# Patient Record
Sex: Male | Born: 1980 | Race: Black or African American | Hispanic: No | Marital: Married | Smoking: Former smoker
Health system: Southern US, Community
[De-identification: ages and names within clinical notes are randomized; demographics above are authoritative.]

## PROBLEM LIST (undated history)

## (undated) DIAGNOSIS — G44019 Episodic cluster headache, not intractable: Secondary | ICD-10-CM

## (undated) DIAGNOSIS — G43909 Migraine, unspecified, not intractable, without status migrainosus: Secondary | ICD-10-CM

## (undated) DIAGNOSIS — G44009 Cluster headache syndrome, unspecified, not intractable: Secondary | ICD-10-CM

## (undated) DIAGNOSIS — R011 Cardiac murmur, unspecified: Secondary | ICD-10-CM

## (undated) HISTORY — PX: NO PAST SURGERIES: SHX2092

## (undated) HISTORY — DX: Episodic cluster headache, not intractable: G44.019

## (undated) HISTORY — DX: Cluster headache syndrome, unspecified, not intractable: G44.009

## (undated) HISTORY — PX: SHOULDER SURGERY: SHX246

## (undated) HISTORY — DX: Migraine, unspecified, not intractable, without status migrainosus: G43.909

---

## 2014-05-14 ENCOUNTER — Emergency Department (HOSPITAL_COMMUNITY)
Admission: EM | Admit: 2014-05-14 | Discharge: 2014-05-14 | Disposition: A | Discharge status: Home / Self Care | Payer: 59 | Attending: Emergency Medicine | Admitting: Emergency Medicine

## 2014-05-14 ENCOUNTER — Emergency Department (HOSPITAL_COMMUNITY): Payer: 59

## 2014-05-14 ENCOUNTER — Encounter (HOSPITAL_COMMUNITY): Payer: Self-pay | Admitting: Emergency Medicine

## 2014-05-14 DIAGNOSIS — R079 Chest pain, unspecified: Secondary | ICD-10-CM | POA: Insufficient documentation

## 2014-05-14 DIAGNOSIS — R51 Headache: Secondary | ICD-10-CM | POA: Insufficient documentation

## 2014-05-14 DIAGNOSIS — R011 Cardiac murmur, unspecified: Secondary | ICD-10-CM | POA: Insufficient documentation

## 2014-05-14 DIAGNOSIS — K5900 Constipation, unspecified: Secondary | ICD-10-CM | POA: Diagnosis not present

## 2014-05-14 DIAGNOSIS — Z720 Tobacco use: Secondary | ICD-10-CM | POA: Insufficient documentation

## 2014-05-14 HISTORY — DX: Cardiac murmur, unspecified: R01.1

## 2014-05-14 LAB — CBC WITH DIFFERENTIAL/PLATELET
BASOS ABS: 0 10*3/uL (ref 0.0–0.1)
BASOS PCT: 0 % (ref 0–1)
EOS ABS: 0.3 10*3/uL (ref 0.0–0.7)
Eosinophils Relative: 4 % (ref 0–5)
HCT: 44.4 % (ref 39.0–52.0)
HEMOGLOBIN: 14.9 g/dL (ref 13.0–17.0)
Lymphocytes Relative: 25 % (ref 12–46)
Lymphs Abs: 1.8 10*3/uL (ref 0.7–4.0)
MCH: 30.2 pg (ref 26.0–34.0)
MCHC: 33.6 g/dL (ref 30.0–36.0)
MCV: 89.9 fL (ref 78.0–100.0)
MONOS PCT: 5 % (ref 3–12)
Monocytes Absolute: 0.4 10*3/uL (ref 0.1–1.0)
NEUTROS ABS: 4.7 10*3/uL (ref 1.7–7.7)
Neutrophils Relative %: 66 % (ref 43–77)
Platelets: 203 10*3/uL (ref 150–400)
RBC: 4.94 MIL/uL (ref 4.22–5.81)
RDW: 12.9 % (ref 11.5–15.5)
WBC: 7.2 10*3/uL (ref 4.0–10.5)

## 2014-05-14 LAB — TROPONIN I

## 2014-05-14 LAB — BASIC METABOLIC PANEL
Anion gap: 4 — ABNORMAL LOW (ref 5–15)
BUN: 11 mg/dL (ref 6–20)
CHLORIDE: 108 mmol/L (ref 101–111)
CO2: 26 mmol/L (ref 22–32)
Calcium: 9.1 mg/dL (ref 8.9–10.3)
Creatinine, Ser: 0.94 mg/dL (ref 0.61–1.24)
GLUCOSE: 95 mg/dL (ref 70–99)
Potassium: 3.6 mmol/L (ref 3.5–5.1)
Sodium: 138 mmol/L (ref 135–145)

## 2014-05-14 MED ORDER — NAPROXEN 500 MG PO TABS: 500.0000 mg | ORAL_TABLET | Freq: Two times a day (BID) | ORAL | Status: DC

## 2014-05-14 MED ORDER — KETOROLAC TROMETHAMINE 60 MG/2ML IM SOLN
60.0000 mg | Freq: Once | INTRAMUSCULAR | Status: AC
  Administered 2014-05-14: 60 mg via INTRAMUSCULAR
  Filled 2014-05-14: qty 2

## 2014-05-14 NOTE — Discharge Instructions (Signed)
Tests were all good. Prescription for pain medicine and try the following for constipation: Dulcolax, milk of Magnesia, magnesium citrate, Miralax.   Increase fluids, fruits, fiber. Try to get a primary care doctor.

## 2014-05-14 NOTE — ED Notes (Signed)
Pt reports increased chest pain for the past 7 days. Pain started in back and spread to chest and L arm. Feeling of sob, dizziness and lightheadedness. Also has feeling of "heart fluttering." also has HA.

## 2014-05-14 NOTE — ED Notes (Signed)
Patient transported to X-ray 

## 2014-05-14 NOTE — ED Notes (Signed)
PT NOT IN ROOM TO DRAW BLOOD

## 2014-05-14 NOTE — ED Provider Notes (Signed)
CSN: [id number]     Arrival date & time 05/14/14  1037 History   First MD Initiated Contact with Patient 05/14/14 1039     Chief Complaint  Patient presents with  . Chest Pain  . Headache     (Consider location/radiation/quality/duration/timing/severity/associated sxs/prior Treatment) HPI....... intermittent left-sided chest pain for 6 years, worse the past week. Symptoms originate in the left upper back and radiate to the left anterior chest. No dyspnea, diaphoresis, nausea. Review systems positive for constipation and headache. He smokes cigars. Negative family history. No other obvious risk factors.   Past Medical History  Diagnosis Date  . Heart murmur    History reviewed. No pertinent past surgical history. History reviewed. No pertinent family history. History  Substance Use Topics  . Smoking status: Current Every Day Smoker  . Smokeless tobacco: Not on file  . Alcohol Use: No    Review of Systems  All other systems reviewed and are negative.     Allergies  Review of patient's allergies indicates no known allergies.  Home Medications   Prior to Admission medications   Medication Sig Start Date End Date Taking? Authorizing Provider  ibuprofen (ADVIL,MOTRIN) 200 MG tablet Take 800 mg by mouth every 6 (six) hours as needed for moderate pain.   Yes Historical Provider, MD  naproxen (NAPROSYN) 500 MG tablet Take 1 tablet (500 mg total) by mouth 2 (two) times daily. 05/14/14   Donnetta HutchingBrian Larrissa Stivers, MD   BP 131/85 mmHg  Pulse 84  Resp 16  SpO2 100% Physical Exam  Constitutional: He is oriented to person, place, and time. He appears well-developed and well-nourished.  HENT:  Head: Normocephalic and atraumatic.  Eyes: Conjunctivae and EOM are normal. Pupils are equal, round, and reactive to light.  Neck: Normal range of motion. Neck supple.  Cardiovascular: Normal rate and regular rhythm.   Pulmonary/Chest: Effort normal and breath sounds normal.  Abdominal: Soft. Bowel sounds  are normal.  Musculoskeletal: Normal range of motion.  Neurological: He is alert and oriented to person, place, and time.  Skin: Skin is warm and dry.  Psychiatric: He has a normal mood and affect. His behavior is normal.  Nursing note and vitals reviewed.   ED Course  Procedures (including critical care time) Labs Review Labs Reviewed  BASIC METABOLIC PANEL - Abnormal; Notable for the following:    Anion gap 4 (*)    All other components within normal limits  CBC WITH DIFFERENTIAL/PLATELET  TROPONIN I    Imaging Review Dg Chest 2 View  05/14/2014   CLINICAL DATA:  Chest pain x7 days  EXAM: CHEST  2 VIEW  COMPARISON:  None.  FINDINGS: Lungs are clear.  No pleural effusion or pneumothorax.  The heart is normal in size.  Visualized osseous structures are within normal limits.  IMPRESSION: Normal chest radiographs.   Electronically Signed   By: Charline BillsSriyesh  Krishnan M.D.   On: 05/14/2014 11:29     EKG Interpretation   Date/Time:  Sunday May 14 2014 10:48:05 EDT Ventricular Rate:  71 PR Interval:  166 QRS Duration: 86 QT Interval:  392 QTC Calculation: 426 R Axis:   86 Text Interpretation:  Sinus rhythm Probable left atrial enlargement ST  elev, probable normal early repol pattern Confirmed by Johnni Wunschel  MD, Kainat Pizana  ([id number]) on 05/14/2014 10:58:25 AM      MDM   Final diagnoses:  Chest pain    Patient is hemodynamically stable. He is very low risk for ACS or pulmonary embolus him.  Screening labs, EKG, chest x-ray all negative. Rx Naprosyn 500 mg for pain.    Donnetta Hutching, MD 05/14/14 1256

## 2015-11-13 ENCOUNTER — Encounter (HOSPITAL_COMMUNITY): Payer: Self-pay | Admitting: Emergency Medicine

## 2015-11-13 ENCOUNTER — Emergency Department (HOSPITAL_COMMUNITY)
Admission: EM | Admit: 2015-11-13 | Discharge: 2015-11-13 | Disposition: A | Discharge status: Home / Self Care | Payer: 59 | Attending: Emergency Medicine | Admitting: Emergency Medicine

## 2015-11-13 DIAGNOSIS — M5442 Lumbago with sciatica, left side: Secondary | ICD-10-CM | POA: Insufficient documentation

## 2015-11-13 DIAGNOSIS — M5431 Sciatica, right side: Secondary | ICD-10-CM

## 2015-11-13 DIAGNOSIS — F17210 Nicotine dependence, cigarettes, uncomplicated: Secondary | ICD-10-CM | POA: Insufficient documentation

## 2015-11-13 DIAGNOSIS — M5432 Sciatica, left side: Secondary | ICD-10-CM

## 2015-11-13 DIAGNOSIS — Z79899 Other long term (current) drug therapy: Secondary | ICD-10-CM | POA: Insufficient documentation

## 2015-11-13 DIAGNOSIS — M5441 Lumbago with sciatica, right side: Secondary | ICD-10-CM | POA: Insufficient documentation

## 2015-11-13 MED ORDER — KETOROLAC TROMETHAMINE 30 MG/ML IJ SOLN
30.0000 mg | Freq: Once | INTRAMUSCULAR | Status: AC
  Administered 2015-11-13: 30 mg via INTRAMUSCULAR
  Filled 2015-11-13: qty 1

## 2015-11-13 MED ORDER — PREDNISONE 10 MG (21) PO TBPK: 10.0000 mg | ORAL_TABLET | Freq: Every day | ORAL | 0 refills | Status: DC

## 2015-11-13 MED ORDER — HYDROCODONE-ACETAMINOPHEN 5-325 MG PO TABS: 1.0000 | ORAL_TABLET | ORAL | 0 refills | Status: DC | PRN

## 2015-11-13 MED ORDER — DIAZEPAM 5 MG PO TABS: 5.0000 mg | ORAL_TABLET | Freq: Two times a day (BID) | ORAL | 0 refills | Status: DC

## 2015-11-13 MED ORDER — IBUPROFEN 800 MG PO TABS: 800.0000 mg | ORAL_TABLET | Freq: Three times a day (TID) | ORAL | 0 refills | Status: DC

## 2015-11-13 MED ORDER — DIAZEPAM 5 MG PO TABS
5.0000 mg | ORAL_TABLET | Freq: Once | ORAL | Status: AC
  Administered 2015-11-13: 5 mg via ORAL
  Filled 2015-11-13: qty 1

## 2015-11-13 MED ORDER — HYDROCODONE-ACETAMINOPHEN 5-325 MG PO TABS
1.0000 | ORAL_TABLET | Freq: Once | ORAL | Status: AC
  Administered 2015-11-13: 1 via ORAL
  Filled 2015-11-13: qty 1

## 2015-11-13 MED ORDER — DEXAMETHASONE SODIUM PHOSPHATE 10 MG/ML IJ SOLN
10.0000 mg | Freq: Once | INTRAMUSCULAR | Status: AC
  Administered 2015-11-13: 10 mg via INTRAMUSCULAR
  Filled 2015-11-13: qty 1

## 2015-11-13 NOTE — ED Notes (Signed)
MD at bedside. 

## 2015-11-13 NOTE — ED Notes (Signed)
+  pulses, cns intact.

## 2015-11-13 NOTE — ED Provider Notes (Signed)
[facility]-EMERGENCY DEPT Provider Note   CSN: [id number] Arrival date & time: 11/13/15  0850     History   Chief Complaint Chief Complaint  Patient presents with  . Back Pain    HPI Richard Gonzalez is a 35 y.o. male.  Pt said that he was pulling up a chicken coop on Sunday (10/29) evening.  He felt a muscle pull in his back and has pain going down both legs.  He denies any problems with bowel or bladder.  The pt said that he has had something like this in the past, but nothing recently.      Past Medical History:  Diagnosis Date  . Heart murmur     There are no active problems to display for this patient.   History reviewed. No pertinent surgical history.     Home Medications    Prior to Admission medications   Medication Sig Start Date End Date Taking? Authorizing Provider  cyclobenzaprine (FLEXERIL) 10 MG tablet Take 10 mg by mouth once.   Yes Historical Provider, MD  diazepam (VALIUM) 5 MG tablet Take 1 tablet (5 mg total) by mouth 2 (two) times daily. 11/13/15   Jacalyn LefevreJulie Lenda Baratta, MD  HYDROcodone-acetaminophen (NORCO/VICODIN) 5-325 MG tablet Take 1 tablet by mouth every 4 (four) hours as needed. 11/13/15   Jacalyn LefevreJulie Lindy Pennisi, MD  ibuprofen (ADVIL,MOTRIN) 800 MG tablet Take 1 tablet (800 mg total) by mouth 3 (three) times daily. 11/13/15   Jacalyn LefevreJulie Zakiyah Diop, MD  predniSONE (STERAPRED UNI-PAK 21 TAB) 10 MG (21) TBPK tablet Take 1 tablet (10 mg total) by mouth daily. Take 6 tabs by mouth daily  for 2 days, then 5 tabs for 2 days, then 4 tabs for 2 days, then 3 tabs for 2 days, 2 tabs for 2 days, then 1 tab by mouth daily for 2 days 11/13/15   Jacalyn LefevreJulie Fredricka Kohrs, MD    Family History No family history on file.  Social History Social History  Substance Use Topics  . Smoking status: Current Every Day Smoker    Types: Cigarettes  . Smokeless tobacco: Never Used  . Alcohol use No     Allergies   Review of patient's allergies indicates no known allergies.   Review of  Systems Review of Systems  Musculoskeletal: Positive for back pain.  All other systems reviewed and are negative.    Physical Exam Updated Vital Signs BP 131/80 (BP Location: Right Arm)   Pulse 79   Temp 97.9 F (36.6 C) (Oral)   Resp 18   SpO2 100%   Physical Exam  Constitutional: He is oriented to person, place, and time. He appears well-developed and well-nourished.  HENT:  Head: Normocephalic and atraumatic.  Right Ear: External ear normal.  Left Ear: External ear normal.  Nose: Nose normal.  Mouth/Throat: Oropharynx is clear and moist.  Eyes: Conjunctivae and EOM are normal. Pupils are equal, round, and reactive to light.  Neck: Normal range of motion. Neck supple.  Cardiovascular: Normal rate, regular rhythm, normal heart sounds and intact distal pulses.   Pulmonary/Chest: Effort normal and breath sounds normal.  Abdominal: Soft. Bowel sounds are normal.  Musculoskeletal:       Arms: Neurological: He is alert and oriented to person, place, and time.  Skin: Skin is warm.  Psychiatric: He has a normal mood and affect. His behavior is normal. Judgment and thought content normal.  Nursing note and vitals reviewed.    ED Treatments / Results  Labs (all labs ordered are listed, but  only abnormal results are displayed) Labs Reviewed - No data to display  EKG  EKG Interpretation None       Radiology No results found.  Procedures Procedures (including critical care time)  Medications Ordered in ED Medications  dexamethasone (DECADRON) injection 10 mg (10 mg Intramuscular Given 11/13/15 0947)  ketorolac (TORADOL) 30 MG/ML injection 30 mg (30 mg Intramuscular Given 11/13/15 0947)  diazepam (VALIUM) tablet 5 mg (5 mg Oral Given 11/13/15 0948)  HYDROcodone-acetaminophen (NORCO/VICODIN) 5-325 MG per tablet 1 tablet (1 tablet Oral Given 11/13/15 0948)     Initial Impression / Assessment and Plan / ED Course  I have reviewed the triage vital signs and the  nursing notes.  Pertinent labs & imaging results that were available during my care of the patient were reviewed by me and considered in my medical decision making (see chart for details).  Clinical Course    Pt is feeling much better.  He is able to ambulate on his own.  He will be given the number to ortho.  He knows to return if worse.  Final Clinical Impressions(s) / ED Diagnoses   Final diagnoses:  Bilateral sciatica    New Prescriptions New Prescriptions   DIAZEPAM (VALIUM) 5 MG TABLET    Take 1 tablet (5 mg total) by mouth 2 (two) times daily.   HYDROCODONE-ACETAMINOPHEN (NORCO/VICODIN) 5-325 MG TABLET    Take 1 tablet by mouth every 4 (four) hours as needed.   IBUPROFEN (ADVIL,MOTRIN) 800 MG TABLET    Take 1 tablet (800 mg total) by mouth 3 (three) times daily.   PREDNISONE (STERAPRED UNI-PAK 21 TAB) 10 MG (21) TBPK TABLET    Take 1 tablet (10 mg total) by mouth daily. Take 6 tabs by mouth daily  for 2 days, then 5 tabs for 2 days, then 4 tabs for 2 days, then 3 tabs for 2 days, 2 tabs for 2 days, then 1 tab by mouth daily for 2 days     Jacalyn LefevreJulie Codie Krogh, MD 11/13/15 1027

## 2015-11-13 NOTE — ED Triage Notes (Signed)
Pt reports pulling a muscle in his back Sunday night. Reports sciatica pain that radiates across back and down legs. Denies issue with bladder or bowel control.

## 2015-12-20 ENCOUNTER — Other Ambulatory Visit: Payer: Self-pay | Admitting: Occupational Medicine

## 2015-12-20 ENCOUNTER — Ambulatory Visit (HOSPITAL_COMMUNITY)
Admission: EM | Admit: 2015-12-20 | Discharge: 2015-12-20 | Disposition: A | Discharge status: Other Acute Inpatient Hospital | Payer: 59

## 2015-12-20 ENCOUNTER — Ambulatory Visit: Payer: Self-pay

## 2015-12-20 DIAGNOSIS — M25562 Pain in left knee: Secondary | ICD-10-CM

## 2017-04-09 DIAGNOSIS — Z0000 Encounter for general adult medical examination without abnormal findings: Secondary | ICD-10-CM | POA: Diagnosis not present

## 2017-04-09 DIAGNOSIS — R51 Headache: Secondary | ICD-10-CM | POA: Diagnosis not present

## 2017-04-09 DIAGNOSIS — Z23 Encounter for immunization: Secondary | ICD-10-CM | POA: Diagnosis not present

## 2017-04-09 DIAGNOSIS — R079 Chest pain, unspecified: Secondary | ICD-10-CM | POA: Diagnosis not present

## 2017-04-20 ENCOUNTER — Telehealth: Payer: Self-pay

## 2017-04-20 NOTE — Telephone Encounter (Signed)
Sent referral to scheduling, attached notes to April file.  

## 2017-04-21 ENCOUNTER — Telehealth: Payer: Self-pay

## 2017-04-21 NOTE — Telephone Encounter (Signed)
SENT REFERRAL TO SCHEDULING 

## 2017-04-23 DIAGNOSIS — R079 Chest pain, unspecified: Secondary | ICD-10-CM

## 2017-04-23 DIAGNOSIS — Z0000 Encounter for general adult medical examination without abnormal findings: Secondary | ICD-10-CM | POA: Diagnosis not present

## 2017-04-23 DIAGNOSIS — Z13220 Encounter for screening for lipoid disorders: Secondary | ICD-10-CM | POA: Diagnosis not present

## 2017-04-23 HISTORY — DX: Chest pain, unspecified: R07.9

## 2017-04-27 ENCOUNTER — Ambulatory Visit: Payer: BLUE CROSS/BLUE SHIELD | Admitting: Cardiology

## 2017-04-27 ENCOUNTER — Encounter: Payer: Self-pay | Admitting: Cardiology

## 2017-04-27 ENCOUNTER — Encounter: Payer: Self-pay | Admitting: *Deleted

## 2017-04-27 VITALS — BP 122/78 | HR 78 | Ht 72.0 in | Wt 177.4 lb

## 2017-04-27 DIAGNOSIS — R002 Palpitations: Secondary | ICD-10-CM | POA: Diagnosis not present

## 2017-04-27 DIAGNOSIS — F17200 Nicotine dependence, unspecified, uncomplicated: Secondary | ICD-10-CM

## 2017-04-27 DIAGNOSIS — E785 Hyperlipidemia, unspecified: Secondary | ICD-10-CM | POA: Diagnosis not present

## 2017-04-27 DIAGNOSIS — R011 Cardiac murmur, unspecified: Secondary | ICD-10-CM | POA: Insufficient documentation

## 2017-04-27 DIAGNOSIS — IMO0001 Reserved for inherently not codable concepts without codable children: Secondary | ICD-10-CM

## 2017-04-27 DIAGNOSIS — R072 Precordial pain: Secondary | ICD-10-CM

## 2017-04-27 HISTORY — DX: Hyperlipidemia, unspecified: E78.5

## 2017-04-27 HISTORY — DX: Palpitations: R00.2

## 2017-04-27 HISTORY — DX: Reserved for inherently not codable concepts without codable children: IMO0001

## 2017-04-27 HISTORY — DX: Nicotine dependence, unspecified, uncomplicated: F17.200

## 2017-04-27 MED ORDER — ASPIRIN EC 81 MG PO TBEC: 81.0000 mg | DELAYED_RELEASE_TABLET | Freq: Every day | ORAL | 3 refills | Status: DC

## 2017-04-27 NOTE — Progress Notes (Signed)
Cardiology Consultation:    Date:  04/27/2017   ID:  Foye Spurling, DOB 06/07/1980, MRN [id number]  PCP:  [name], PA-C  Cardiologist:  Gypsy Balsam, MD   Referring MD: [name], PA-C   Chief Complaint  Patient presents with  . Chest Pain  I have a chest pain  History of Present Illness:    Richard Gonzalez is a 37 y.o. male who is being seen today for the evaluation of his pain at the request of Scifres, Nicole Cella, [place].  For years he had chest pain.  He described pain as uneasy sensation in the chest partially reproducible by pressing chest wall.  There is some radiation towards left arm interestingly he said that his pain can last for days however it is on and off during that.  Lasting up to 2 hours.  There is no provoking or relieving factors.  He described strength of the pain is 6-7 in scale up to 10.  There is no shortness of breath no palpitations no sweating associated with this sensation.  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  I seen him being in the emergency room 2 years ago because of chest pain.  Everything was negative at that time but no stress test has been done.  He also described to have some palpitations he described this as a fast irregular heartbeats that happen maybe every 2 or 3 months.  Last for about 10-15 minutes sometimes he said he feels dizzy when it happens.  He never passed out.  He can walk climb straight with no difficulties.  He does have some he should with his back however it is been acting on and off every 2 months to have some problem.  He tells me that he does have high cholesterol however no medication has been given to him.  He also smoked cigar now before cigarettes.  Have beer from time to time  Past Medical History:  Diagnosis Date  . Heart murmur     Past Surgical History:  Procedure Laterality Date  . NO PAST SURGERIES      Current Medications: Current Meds  Medication Sig  .  acetaminophen (TYLENOL) 325 MG tablet Take 2 capsules by mouth every 4 (four) hours as needed.     Allergies:   Patient has no known allergies.   Social History   Socioeconomic History  . Marital status: Married    Spouse name: Not on file  . Number of children: Not on file  . Years of education: Not on file  . Highest education level: Not on file  Occupational History  . Not on file  Social Needs  . Financial resource strain: Not on file  . Food insecurity:    Worry: Not on file    Inability: Not on file  . Transportation needs:    Medical: Not on file    Non-medical: Not on file  Tobacco Use  . Smoking status: Current Some Day Smoker    Types: Cigarettes, Cigars  . Smokeless tobacco: Never Used  Substance and Sexual Activity  . Alcohol use: No  . Drug use: No  . Sexual activity: Not on file  Lifestyle  . Physical activity:    Days per week: Not on file    Minutes per session: Not on file  . Stress: Not on file  Relationships  . Social connections:    Talks on phone: Not on file  Gets together: Not on file    Attends religious service: Not on file    Active member of club or organization: Not on file    Attends meetings of clubs or organizations: Not on file    Relationship status: Not on file  Other Topics Concern  . Not on file  Social History Narrative  . Not on file     Family History: The patient's family history includes Heart failure in his maternal grandmother and mother; Hypertension in his maternal grandmother and mother. ROS:   Please see the history of present illness.    All 14 point review of systems negative except as described per history of present illness.  EKGs/Labs/Other Studies Reviewed:    The following studies were reviewed today:   EKG:  EKG is  ordered today.  The ekg ordered today demonstrates normal sinus rhythm normal P interval left axis deviation left ventricular hypertrophy  Recent Labs: No results found for requested  labs within last 8760 hours.  Recent Lipid Panel No results found for: CHOL, TRIG, HDL, CHOLHDL, VLDL, LDLCALC, LDLDIRECT  Physical Exam:    VS:  BP 122/78 (BP Location: Left Arm)   Pulse 78   Ht 6' (1.829 m)   Wt 177 lb 6.4 oz (80.5 kg)   SpO2 98%   BMI 24.06 kg/m     Wt Readings from Last 3 Encounters:  04/27/17 177 lb 6.4 oz (80.5 kg)     GEN:  Well nourished, well developed in no acute distress HEENT: Normal NECK: No JVD; No carotid bruits LYMPHATICS: No lymphadenopathy CARDIAC: RRR, soft systolic murmur grade 1/6 best heard at the left border of the sternum, no rubs, no gallops RESPIRATORY:  Clear to auscultation without rales, wheezing or rhonchi  ABDOMEN: Soft, non-tender, non-distended MUSCULOSKELETAL:  No edema; No deformity  SKIN: Warm and dry NEUROLOGIC:  Alert and oriented x 3 PSYCHIATRIC:  Normal affect   ASSESSMENT:    1. Precordial pain   2. Palpitations   3. Smoking   4. Dyslipidemia   5. Heart murmur    PLAN:    In order of problems listed above:  1. Precordial chest pain: I have to admit very atypical.  I will ask you to start taking one baby aspirin every single day.  He will be scheduled to have stress echocardiogram to make sure he has no inducible ischemia however I doubt very much that that will be the case. 2. Palpitations he describes as irregular.  I will put event recorder on him for a month we will schedule him to have a stress test make sure there is no ischemia as well as we will schedule him to have echocardiogram to assess left ventricular ejection fraction left atrial size. 3. Smoking: He was told to quit and he understand this is a problem we will work on that. 4. Dyslipidemia: We will call primary care physician to get his fasting lipid profile 5. Heart murmur very soft I doubt very much that this is something critical by echocardiogram need to be done.  See him back in my office in about 1-1/2 months or sooner if he has a  problem   Medication Adjustments/Labs and Tests Ordered: Current medicines are reviewed at length with the patient today.  Concerns regarding medicines are outlined above.  No orders of the defined types were placed in this encounter.  No orders of the defined types were placed in this encounter.   Signed, Georgeanna Lea, MD, [facility]  04/27/2017 9:27 AM    [facility]

## 2017-04-27 NOTE — Patient Instructions (Addendum)
Medication Instructions:  Your physician has recommended you make the following change in your medication:  START aspirin 81 mg daily  Labwork: None   Testing/Procedures: You had an EKG today.  Your physician has requested that you have an echocardiogram. Echocardiography is a painless test that uses sound waves to create images of your heart. It provides your doctor with information about the size and shape of your heart and how well your heart's chambers and valves are working. This procedure takes approximately one hour. There are no restrictions for this procedure.  Your physician has requested that you have a stress echocardiogram. For further information please visit [email or web address]. Please follow instruction sheet as given.  Your physician has recommended that you wear an event monitor. Event monitors are medical devices that record the heart's electrical activity. Doctors most often us these monitors to diagnose arrhythmias. Arrhythmias are problems with the speed or rhythm of the heartbeat. The monitor is a small, portable device. You can wear one while you do your normal daily activities. This is usually used to diagnose what is causing palpitations/syncope (passing out). Wear for 30 days.   Follow-Up: Your physician recommends that you schedule a follow-up appointment in: 6 weeks.  Any Other Special Instructions Will Be Listed Below (If Applicable).     If you need a refill on your cardiac medications before your next appointment, please call your pharmacy.

## 2017-05-21 ENCOUNTER — Telehealth: Payer: Self-pay | Admitting: *Deleted

## 2017-05-21 ENCOUNTER — Ambulatory Visit (HOSPITAL_BASED_OUTPATIENT_CLINIC_OR_DEPARTMENT_OTHER)
Admission: RE | Admit: 2017-05-21 | Discharge: 2017-05-21 | Disposition: A | Discharge status: Home / Self Care | Payer: BLUE CROSS/BLUE SHIELD | Source: Ambulatory Visit | Attending: Cardiology | Admitting: Cardiology

## 2017-05-21 DIAGNOSIS — R011 Cardiac murmur, unspecified: Secondary | ICD-10-CM

## 2017-05-21 DIAGNOSIS — R072 Precordial pain: Secondary | ICD-10-CM | POA: Diagnosis not present

## 2017-05-21 DIAGNOSIS — IMO0001 Reserved for inherently not codable concepts without codable children: Secondary | ICD-10-CM

## 2017-05-21 DIAGNOSIS — E785 Hyperlipidemia, unspecified: Secondary | ICD-10-CM

## 2017-05-21 DIAGNOSIS — R002 Palpitations: Secondary | ICD-10-CM | POA: Diagnosis not present

## 2017-05-21 DIAGNOSIS — F17200 Nicotine dependence, unspecified, uncomplicated: Secondary | ICD-10-CM

## 2017-05-21 DIAGNOSIS — I340 Nonrheumatic mitral (valve) insufficiency: Secondary | ICD-10-CM | POA: Insufficient documentation

## 2017-05-21 NOTE — Telephone Encounter (Signed)
Left message to return call about echocardiogram results.

## 2017-05-21 NOTE — Progress Notes (Signed)
Echocardiogram 2D Echocardiogram has been performed.  Richard Gonzalez 05/21/2017, 9:02 AM

## 2017-05-21 NOTE — Telephone Encounter (Signed)
-----   Message from Georgeanna Lea, MD sent at 05/21/2017  1:17 PM EDT ----- Normal LVEF, mild mod MR - medical tharapy

## 2017-05-25 ENCOUNTER — Encounter (HOSPITAL_BASED_OUTPATIENT_CLINIC_OR_DEPARTMENT_OTHER): Payer: Self-pay

## 2017-05-25 ENCOUNTER — Other Ambulatory Visit: Payer: Self-pay

## 2017-05-25 ENCOUNTER — Ambulatory Visit: Payer: BLUE CROSS/BLUE SHIELD

## 2017-05-25 ENCOUNTER — Ambulatory Visit (HOSPITAL_BASED_OUTPATIENT_CLINIC_OR_DEPARTMENT_OTHER)
Admission: RE | Admit: 2017-05-25 | Discharge: 2017-05-25 | Disposition: A | Discharge status: Home / Self Care | Payer: BLUE CROSS/BLUE SHIELD | Source: Ambulatory Visit | Attending: Cardiology | Admitting: Cardiology

## 2017-05-25 DIAGNOSIS — R002 Palpitations: Secondary | ICD-10-CM | POA: Diagnosis not present

## 2017-05-25 DIAGNOSIS — R072 Precordial pain: Secondary | ICD-10-CM | POA: Diagnosis not present

## 2017-05-25 DIAGNOSIS — F17200 Nicotine dependence, unspecified, uncomplicated: Secondary | ICD-10-CM

## 2017-05-25 DIAGNOSIS — R079 Chest pain, unspecified: Secondary | ICD-10-CM

## 2017-05-25 DIAGNOSIS — R011 Cardiac murmur, unspecified: Secondary | ICD-10-CM

## 2017-05-25 DIAGNOSIS — E785 Hyperlipidemia, unspecified: Secondary | ICD-10-CM

## 2017-05-26 ENCOUNTER — Telehealth (HOSPITAL_COMMUNITY): Payer: Self-pay | Admitting: *Deleted

## 2017-05-26 NOTE — Telephone Encounter (Signed)
Patient given detailed instructions per Myocardial Perfusion Study Information Sheet for the test on 05/29/17 at 0930. Patient notified to arrive 15 minutes early and that it is imperative to arrive on time for appointment to keep from having the test rescheduled.  If you need to cancel or reschedule your appointment, please call the office within 24 hours of your appointment. . Patient verbalized understanding.Richard Gonzalez, [name]

## 2017-05-29 ENCOUNTER — Ambulatory Visit (HOSPITAL_COMMUNITY): Payer: BLUE CROSS/BLUE SHIELD | Attending: Cardiology

## 2017-05-29 DIAGNOSIS — R011 Cardiac murmur, unspecified: Secondary | ICD-10-CM | POA: Diagnosis not present

## 2017-05-29 DIAGNOSIS — I2510 Atherosclerotic heart disease of native coronary artery without angina pectoris: Secondary | ICD-10-CM | POA: Diagnosis not present

## 2017-05-29 DIAGNOSIS — R9439 Abnormal result of other cardiovascular function study: Secondary | ICD-10-CM | POA: Insufficient documentation

## 2017-05-29 DIAGNOSIS — F17200 Nicotine dependence, unspecified, uncomplicated: Secondary | ICD-10-CM | POA: Insufficient documentation

## 2017-05-29 DIAGNOSIS — R079 Chest pain, unspecified: Secondary | ICD-10-CM | POA: Diagnosis not present

## 2017-05-29 DIAGNOSIS — I517 Cardiomegaly: Secondary | ICD-10-CM | POA: Insufficient documentation

## 2017-05-29 DIAGNOSIS — R002 Palpitations: Secondary | ICD-10-CM | POA: Insufficient documentation

## 2017-05-29 LAB — MYOCARDIAL PERFUSION IMAGING
CHL CUP NUCLEAR SDS: 3
CHL CUP NUCLEAR SRS: 6
CSEPHR: 91 %
CSEPPHR: 169 {beats}/min
Estimated workload: 12.9 METS
Exercise duration (min): 10 min
Exercise duration (sec): 45 s
LV dias vol: 182 mL (ref 62–150)
LV sys vol: 41 mL
MPHR: 185 {beats}/min
RATE: 0.23
Rest HR: 80 {beats}/min
SSS: 9
TID: 1.07

## 2017-05-29 MED ORDER — TECHNETIUM TC 99M TETROFOSMIN IV KIT
33.0000 | PACK | Freq: Once | INTRAVENOUS | Status: AC | PRN
  Administered 2017-05-29: 33 via INTRAVENOUS
  Filled 2017-05-29: qty 33

## 2017-05-29 MED ORDER — TECHNETIUM TC 99M TETROFOSMIN IV KIT
10.1000 | PACK | Freq: Once | INTRAVENOUS | Status: AC | PRN
  Administered 2017-05-29: 10.1 via INTRAVENOUS
  Filled 2017-05-29: qty 11

## 2017-06-04 ENCOUNTER — Ambulatory Visit (INDEPENDENT_AMBULATORY_CARE_PROVIDER_SITE_OTHER): Payer: BLUE CROSS/BLUE SHIELD | Admitting: Cardiology

## 2017-06-04 VITALS — BP 144/72 | HR 83 | Ht 72.0 in | Wt 173.4 lb

## 2017-06-04 DIAGNOSIS — E785 Hyperlipidemia, unspecified: Secondary | ICD-10-CM

## 2017-06-04 DIAGNOSIS — R002 Palpitations: Secondary | ICD-10-CM | POA: Diagnosis not present

## 2017-06-04 DIAGNOSIS — R079 Chest pain, unspecified: Secondary | ICD-10-CM

## 2017-06-04 DIAGNOSIS — F17200 Nicotine dependence, unspecified, uncomplicated: Secondary | ICD-10-CM | POA: Diagnosis not present

## 2017-06-04 DIAGNOSIS — R072 Precordial pain: Secondary | ICD-10-CM

## 2017-06-04 NOTE — Progress Notes (Signed)
Cardiology Office Note:    Date:  06/04/2017   ID:  Foye Spurling, DOB 1980/08/07, MRN [id number]  PCP:  [name], PA-C  Cardiologist:  Gypsy Balsam, MD    Referring MD: [name], PA-C   Chief Complaint  Patient presents with  . Follow up Stress Test  I am here to talk about stress test  History of Present Illness:    Richard Gonzalez is a 37 y.o. male with atypical chest pain who had a nuclear stress test done.  Stress test showed possibility of ischemia involving apex there a few questions about the stress test.  Overall uptake of the isotope from the view that showed ischemia in the apex was lower than all other images on top of that his ejection fraction was diminished based on stress test however echocardiogram clearly showed preserved left ventricular ejection fraction.  I do have some doubts about accuracy of the test we debated about what to do with the situation we talked about potentially cardiac catheterization which I think would be too hard to if I offer him CT of his chest to look for coronary calcium  Past Medical History:  Diagnosis Date  . Heart murmur     Past Surgical History:  Procedure Laterality Date  . NO PAST SURGERIES      Current Medications: Current Meds  Medication Sig  . acetaminophen (TYLENOL) 325 MG tablet Take 2 capsules by mouth every 4 (four) hours as needed.  Marland Kitchen aspirin EC 81 MG tablet Take 1 tablet (81 mg total) by mouth daily.     Allergies:   Patient has no known allergies.   Social History   Socioeconomic History  . Marital status: Married    Spouse name: Not on file  . Number of children: Not on file  . Years of education: Not on file  . Highest education level: Not on file  Occupational History  . Not on file  Social Needs  . Financial resource strain: Not on file  . Food insecurity:    Worry: Not on file    Inability: Not on file  . Transportation needs:    Medical: Not on file    Non-medical: Not on file   Tobacco Use  . Smoking status: Current Some Day Smoker    Types: Cigarettes, Cigars  . Smokeless tobacco: Never Used  Substance and Sexual Activity  . Alcohol use: No  . Drug use: No  . Sexual activity: Not on file  Lifestyle  . Physical activity:    Days per week: Not on file    Minutes per session: Not on file  . Stress: Not on file  Relationships  . Social connections:    Talks on phone: Not on file    Gets together: Not on file    Attends religious service: Not on file    Active member of club or organization: Not on file    Attends meetings of clubs or organizations: Not on file    Relationship status: Not on file  Other Topics Concern  . Not on file  Social History Narrative  . Not on file     Family History: The patient's family history includes Heart failure in his maternal grandmother and mother; Hypertension in his maternal grandmother and mother. ROS:   Please see the history of present illness.    All 14 point review of systems negative except as described per history of present illness  EKGs/Labs/Other Studies Reviewed:  Recent Labs: No results found for requested labs within last 8760 hours.  Recent Lipid Panel No results found for: CHOL, TRIG, HDL, CHOLHDL, VLDL, LDLCALC, LDLDIRECT  Physical Exam:    VS:  BP (!) 144/72   Pulse 83   Ht 6' (1.829 m)   Wt 173 lb 6.4 oz (78.7 kg)   SpO2 98%   BMI 23.52 kg/m     Wt Readings from Last 3 Encounters:  06/04/17 173 lb 6.4 oz (78.7 kg)  04/27/17 177 lb 6.4 oz (80.5 kg)     GEN:  Well nourished, well developed in no acute distress HEENT: Normal NECK: No JVD; No carotid bruits LYMPHATICS: No lymphadenopathy CARDIAC: RRR, holosystolic murmur grade 1/6 to 2/6 best heard at the apex, no rubs, no gallops RESPIRATORY:  Clear to auscultation without rales, wheezing or rhonchi  ABDOMEN: Soft, non-tender, non-distended MUSCULOSKELETAL:  No edema; No deformity  SKIN: Warm and dry LOWER EXTREMITIES: no  swelling NEUROLOGIC:  Alert and oriented x 3 PSYCHIATRIC:  Normal affect   ASSESSMENT:    1. Precordial pain   2. Dyslipidemia   3. Palpitations   4. Smoking    PLAN:    In order of problems listed above:  1. Precordial pain: Very atypical.  Stress test abnormal but there is some question about the accuracy of the test.  We will schedule him to have calcium score. 2. Dyslipidemia will wait for results of calcium score. 3. Palpitations he still wearing an event recorder will wait for results of it 4. Smoking he is trying to quit. 5. Mitral regurgitation mild to moderate based on echocardiogram.  Conservative approach.   Medication Adjustments/Labs and Tests Ordered: Current medicines are reviewed at length with the patient today.  Concerns regarding medicines are outlined above.  No orders of the defined types were placed in this encounter.  Medication changes: No orders of the defined types were placed in this encounter.   Signed, Georgeanna Lea, MD, [facility] 06/04/2017 3:35 PM    [facility]

## 2017-06-04 NOTE — Patient Instructions (Signed)
Medication Instructions:  Your physician recommends that you continue on your current medications as directed. Please refer to the Current Medication list given to you today.   Labwork: Your physician recommends that you return for lab work 3-7 days before your CT cardiac scoring. You can go to the nearest [facility] or come back to our office for blood work. No appointment needed.   Testing/Procedures: Your physician has requested that you have cardiac CT. Cardiac computed tomography (CT) is a painless test that uses an x-ray machine to take clear, detailed pictures of your heart. For further information please visit [email or web address] Please follow instruction sheet as given.  Follow-Up: Your physician recommends that you schedule a follow-up appointment in: 1 month.  If you need a refill on your cardiac medications before your next appointment, please call your pharmacy.   Thank you for choosing [facility]! Mady Gemma, RN [phone]

## 2017-06-18 ENCOUNTER — Inpatient Hospital Stay: Admission: RE | Admit: 2017-06-18 | Payer: Self-pay | Source: Ambulatory Visit

## 2017-06-26 ENCOUNTER — Ambulatory Visit (INDEPENDENT_AMBULATORY_CARE_PROVIDER_SITE_OTHER)
Admission: RE | Admit: 2017-06-26 | Discharge: 2017-06-26 | Disposition: A | Discharge status: Home / Self Care | Payer: Self-pay | Source: Ambulatory Visit | Attending: Cardiology | Admitting: Cardiology

## 2017-06-26 DIAGNOSIS — R079 Chest pain, unspecified: Secondary | ICD-10-CM

## 2017-07-03 ENCOUNTER — Encounter: Payer: Self-pay | Admitting: Cardiology

## 2017-07-03 ENCOUNTER — Ambulatory Visit: Payer: BLUE CROSS/BLUE SHIELD | Admitting: Cardiology

## 2017-07-03 ENCOUNTER — Ambulatory Visit: Payer: Self-pay | Admitting: Cardiology

## 2017-07-03 VITALS — BP 120/70 | HR 70 | Ht 72.0 in | Wt 182.1 lb

## 2017-07-03 DIAGNOSIS — R072 Precordial pain: Secondary | ICD-10-CM | POA: Diagnosis not present

## 2017-07-03 DIAGNOSIS — E785 Hyperlipidemia, unspecified: Secondary | ICD-10-CM

## 2017-07-03 DIAGNOSIS — I340 Nonrheumatic mitral (valve) insufficiency: Secondary | ICD-10-CM

## 2017-07-03 HISTORY — DX: Nonrheumatic mitral (valve) insufficiency: I34.0

## 2017-07-03 NOTE — Progress Notes (Signed)
Cardiology Office Note:    Date:  07/03/2017   ID:  Richard Spurlingracey Eberwein, DOB November 27, 1980, MRN [id number]  PCP:  Clementeen GrahamScifres, Dorothy, PA-C  Cardiologist:  Gypsy Balsamobert Eri Mcevers, MD    Referring MD: Clementeen GrahamScifres, Dorothy, PA-C   Chief Complaint  Patient presents with  . 6 week follow up  Still having chest pain  History of Present Illness:    Richard Gonzalez is a 37 y.o. male chest pain.  He did have a stress test done which was poor quality and some question about potential ischemia however after that he did have CTA done for calcium score and calcium score was 0 his pain is very atypical and now I doubt very much is related to his heart.  I told him that he to talk to his primary care physician try to investigate what could be other reasons for his symptoms.  He also complained of having headache that need to be investigated.  He does have a little tenderness on pressing his chest wall I told him to use Motrin continuously for about a week and see if that will improve the symptom.  Past Medical History:  Diagnosis Date  . Heart murmur     Past Surgical History:  Procedure Laterality Date  . NO PAST SURGERIES      Current Medications: Current Meds  Medication Sig  . acetaminophen (TYLENOL) 325 MG tablet Take 2 capsules by mouth every 4 (four) hours as needed.  Marland Kitchen. aspirin EC 81 MG tablet Take 1 tablet (81 mg total) by mouth daily.     Allergies:   Patient has no known allergies.   Social History   Socioeconomic History  . Marital status: Married    Spouse name: Not on file  . Number of children: Not on file  . Years of education: Not on file  . Highest education level: Not on file  Occupational History  . Not on file  Social Needs  . Financial resource strain: Not on file  . Food insecurity:    Worry: Not on file    Inability: Not on file  . Transportation needs:    Medical: Not on file    Non-medical: Not on file  Tobacco Use  . Smoking status: Current Some Day Smoker    Types:  Cigarettes, Cigars  . Smokeless tobacco: Never Used  Substance and Sexual Activity  . Alcohol use: No  . Drug use: No  . Sexual activity: Not on file  Lifestyle  . Physical activity:    Days per week: Not on file    Minutes per session: Not on file  . Stress: Not on file  Relationships  . Social connections:    Talks on phone: Not on file    Gets together: Not on file    Attends religious service: Not on file    Active member of club or organization: Not on file    Attends meetings of clubs or organizations: Not on file    Relationship status: Not on file  Other Topics Concern  . Not on file  Social History Narrative  . Not on file     Family History: The patient's family history includes Heart failure in his maternal grandmother and mother; Hypertension in his maternal grandmother and mother. ROS:   Please see the history of present illness.    All 14 point review of systems negative except as described per history of present illness  EKGs/Labs/Other Studies Reviewed:      Recent Labs:  No results found for requested labs within last 8760 hours.  Recent Lipid Panel No results found for: CHOL, TRIG, HDL, CHOLHDL, VLDL, LDLCALC, LDLDIRECT  Physical Exam:    VS:  BP 120/70   Pulse 70   Ht 6' (1.829 m)   Wt 182 lb 1.9 oz (82.6 kg)   SpO2 98%   BMI 24.70 kg/m     Wt Readings from Last 3 Encounters:  07/03/17 182 lb 1.9 oz (82.6 kg)  06/04/17 173 lb 6.4 oz (78.7 kg)  04/27/17 177 lb 6.4 oz (80.5 kg)     GEN:  Well nourished, well developed in no acute distress HEENT: Normal NECK: No JVD; No carotid bruits LYMPHATICS: No lymphadenopathy CARDIAC: RRR, no murmurs, no rubs, no gallops RESPIRATORY:  Clear to auscultation without rales, wheezing or rhonchi  ABDOMEN: Soft, non-tender, non-distended MUSCULOSKELETAL:  No edema; No deformity  SKIN: Warm and dry LOWER EXTREMITIES: no swelling NEUROLOGIC:  Alert and oriented x 3 PSYCHIATRIC:  Normal affect    ASSESSMENT:    1. Precordial pain   2. Non-rheumatic mitral regurgitation   3. Dyslipidemia    PLAN:    In order of problems listed above:  1. Precordial pain.  So far cardiac work-up negative.  Primary care physician will investigate other potential reasons for his symptoms.  He does complain of having chronic headaches he may require CT of his head. 2. Nonrheumatic mitral regurgitation.  Denies have any symptoms from that that required monitoring with yearly echocardiogram. 3. Dyslipidemia he will try to comply with diet his calcium score again was 0 on the CT.  Therefore we may be a little less aggressive.  Him back in about 6 months sooner if he get a problem   Medication Adjustments/Labs and Tests Ordered: Current medicines are reviewed at length with the patient today.  Concerns regarding medicines are outlined above.  No orders of the defined types were placed in this encounter.  Medication changes: No orders of the defined types were placed in this encounter.   Signed, Georgeanna Lea, MD, [facility] 07/03/2017 12:25 PM    [facility]

## 2017-07-03 NOTE — Patient Instructions (Signed)
Medication Instructions:  Your physician recommends that you continue on your current medications as directed. Please refer to the Current Medication list given to you today.  Try Motrin to see if it relieves you chest pain, other wise follow up with your primary care for further work up.  Labwork: None ordered  Testing/Procedures: One ordered  Follow-Up: Your physician recommends that you schedule a follow-up appointment in: 6 months with Dr. Bing MatterKrasowski. You will receive a letter in the mail to schedule your follow up.   Any Other Special Instructions Will Be Listed Below (If Applicable).     If you need a refill on your cardiac medications before your next appointment, please call your pharmacy.

## 2017-07-08 DIAGNOSIS — G43009 Migraine without aura, not intractable, without status migrainosus: Secondary | ICD-10-CM | POA: Diagnosis not present

## 2017-10-26 ENCOUNTER — Ambulatory Visit (HOSPITAL_COMMUNITY)
Admission: EM | Admit: 2017-10-26 | Discharge: 2017-10-26 | Disposition: A | Discharge status: Home / Self Care | Payer: BLUE CROSS/BLUE SHIELD | Attending: Family Medicine | Admitting: Family Medicine

## 2017-10-26 ENCOUNTER — Encounter (HOSPITAL_COMMUNITY): Payer: Self-pay | Admitting: Emergency Medicine

## 2017-10-26 ENCOUNTER — Ambulatory Visit (INDEPENDENT_AMBULATORY_CARE_PROVIDER_SITE_OTHER): Payer: BLUE CROSS/BLUE SHIELD

## 2017-10-26 DIAGNOSIS — M79641 Pain in right hand: Secondary | ICD-10-CM

## 2017-10-26 DIAGNOSIS — S6991XA Unspecified injury of right wrist, hand and finger(s), initial encounter: Secondary | ICD-10-CM | POA: Diagnosis not present

## 2017-10-26 MED ORDER — MELOXICAM 7.5 MG PO TABS: 7.5000 mg | ORAL_TABLET | Freq: Every day | ORAL | 0 refills | Status: DC

## 2017-10-26 NOTE — ED Provider Notes (Signed)
[facility]    CSN: [id number] Arrival date & time: 10/26/17  1354     History   Chief Complaint Chief Complaint  Patient presents with  . Hand Pain    HPI Richard Gonzalez is a 37 y.o. male.   Patient is a 37 year old male who presents with right hand pain and injury after punching a microwave yesterday.  Symptoms been constant and remain the same.  Movement of the fourth and fifth digit makes the pain worse.  There is minimal swelling.  He has been taking ibuprofen with minimal relief of pain.  He admits to prior injury to same hand a similar mechanism.  He denies any loss of sensation, numbness, tingling.  ROS per HPI      Past Medical History:  Diagnosis Date  . Heart murmur     Patient Active Problem List   Diagnosis Date Noted  . Mitral regurgitation 07/03/2017  . Palpitations 04/27/2017  . Smoking 04/27/2017  . Dyslipidemia 04/27/2017  . Heart murmur 04/27/2017  . Chest pain 04/23/2017    Past Surgical History:  Procedure Laterality Date  . NO PAST SURGERIES         Home Medications    Prior to Admission medications   Medication Sig Start Date End Date Taking? Authorizing Provider  acetaminophen (TYLENOL) 325 MG tablet Take 2 capsules by mouth every 4 (four) hours as needed.    [provider]  aspirin EC 81 MG tablet Take 1 tablet (81 mg total) by mouth daily. 04/27/17   Georgeanna Lea, MD  meloxicam (MOBIC) 7.5 MG tablet Take 1 tablet (7.5 mg total) by mouth daily. 10/26/17   Janace Aris, NP    Family History Family History  Problem Relation Age of Onset  . Hypertension Mother   . Heart failure Mother   . Heart failure Maternal Grandmother   . Hypertension Maternal Grandmother     Social History Social History   Tobacco Use  . Smoking status: Current Some Day Smoker    Types: Cigarettes, Cigars  . Smokeless tobacco: Never Used  Substance Use Topics  . Alcohol use: No  . Drug use: No     Allergies     Patient has no known allergies.   Review of Systems Review of Systems   Physical Exam Triage Vital Signs ED Triage Vitals  Enc Vitals Group     BP 10/26/17 1422 129/81     Pulse Rate 10/26/17 1422 74     Resp 10/26/17 1422 18     Temp 10/26/17 1422 97.8 F (36.6 C)     Temp src --      SpO2 10/26/17 1422 100 %     Weight --      Height --      Head Circumference --      Peak Flow --      Pain Score 10/26/17 1423 8     Pain Loc --      Pain Edu? --      Excl. in GC? --    No data found.  Updated Vital Signs BP 129/81 (BP Location: Left Arm)   Pulse 74   Temp 97.8 F (36.6 C)   Resp 18   SpO2 100%   Visual Acuity Right Eye Distance:   Left Eye Distance:   Bilateral Distance:    Right Eye Near:   Left Eye Near:    Bilateral Near:     Physical Exam  Constitutional:  He is oriented to person, place, and time. He appears well-developed and well-nourished.  Very pleasant. Non toxic or ill appearing.   HENT:  Head: Normocephalic and atraumatic.  Eyes: Conjunctivae are normal.  Neck: Normal range of motion.  Pulmonary/Chest: Effort normal.  Musculoskeletal: Normal range of motion. He exhibits edema and tenderness. He exhibits no deformity.  Mild swelling to right distal 5th metacarpal. Good flexion and extension to 4th and 5th digits. Sensation and radial pulse intact.   Neurological: He is alert and oriented to person, place, and time.  Skin: Skin is warm and dry.  Psychiatric: He has a normal mood and affect.  Nursing note and vitals reviewed.    UC Treatments / Results  Labs (all labs ordered are listed, but only abnormal results are displayed) Labs Reviewed - No data to display  EKG None  Radiology Dg Hand Complete Right  Result Date: 10/26/2017 CLINICAL DATA:  Punch injury to the right hand. Old fifth metacarpal fracture. EXAM: RIGHT HAND - COMPLETE 3+ VIEW COMPARISON:  None. FINDINGS: Cortical irregularity involving the base of the fifth  metacarpal bone appears chronic. Degenerative changes involving the fifth carpal-metacarpal joint. Negative for an acute fracture. Alignment of the hand and wrist are within normal limits. IMPRESSION: 1. No acute bone abnormality to the right hand. 2. 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 Electronically Signed   By: Richarda Overlie M.D.   On: 10/26/2017 14:47    Procedures Procedures (including critical care time)  Medications Ordered in UC Medications - No data to display  Initial Impression / Assessment and Plan / UC Course  I have reviewed the triage vital signs and the nursing notes.  Pertinent labs & imaging results that were available during my care of the patient were reviewed by me and considered in my medical decision making (see chart for details).     No new acute fractures on x ray.  Some evidence of old injury to base of 5th metacarpal Rest, Ice, Elevate and splint meloxicam for pain and inflammation.  Follow up as needed for continued or worsening symptoms Final Clinical Impressions(s) / UC Diagnoses   Final diagnoses:  Hand pain, right     Discharge Instructions     It was nice meeting you!!  Use the splint, ice, rest the hand Meloxicam for pain and inflammation.  Follow up as needed for continued or worsening symptoms     ED Prescriptions    Medication Sig Dispense Auth. Provider   meloxicam (MOBIC) 7.5 MG tablet Take 1 tablet (7.5 mg total) by mouth daily. 30 tablet Dahlia Byes A, NP     Controlled Substance Prescriptions [place] Controlled Substance Registry consulted? Not Applicable   Janace Aris, NP 10/26/17 1553

## 2017-10-26 NOTE — Discharge Instructions (Addendum)
It was nice meeting you!!  Use the splint, ice, rest the hand Meloxicam for pain and inflammation.  Follow up as needed for continued or worsening symptoms

## 2017-10-26 NOTE — ED Triage Notes (Signed)
Pt sts right hand pain after punching microwave yesterday

## 2019-04-12 ENCOUNTER — Ambulatory Visit (INDEPENDENT_AMBULATORY_CARE_PROVIDER_SITE_OTHER): Payer: 59 | Admitting: Otolaryngology

## 2019-04-12 ENCOUNTER — Encounter (INDEPENDENT_AMBULATORY_CARE_PROVIDER_SITE_OTHER): Payer: Self-pay | Admitting: Otolaryngology

## 2019-04-12 ENCOUNTER — Other Ambulatory Visit: Payer: Self-pay

## 2019-04-12 VITALS — Temp 98.2°F

## 2019-04-12 DIAGNOSIS — H6122 Impacted cerumen, left ear: Secondary | ICD-10-CM

## 2019-04-12 DIAGNOSIS — J342 Deviated nasal septum: Secondary | ICD-10-CM

## 2019-04-12 NOTE — Progress Notes (Signed)
HPI: Richard Gonzalez is a 39 y.o. male who presents is referred by his PCP for evaluation of left ear blockage as well as trouble breathing through the left side of his nose.  He initially had problems with the left ear when a bug flew in his ear about a month ago.  He has tried using all of oil as well as hydrogen peroxide but still has partial blockage of the left ear.  No pain or discomfort. He also complains of trouble breathing through the left nostril. He has history of migraine headaches and is scheduled to see neurology concerning this..  Past Medical History:  Diagnosis Date  . Heart murmur    Past Surgical History:  Procedure Laterality Date  . NO PAST SURGERIES     Social History   Socioeconomic History  . Marital status: Married    Spouse name: Not on file  . Number of children: Not on file  . Years of education: Not on file  . Highest education level: Not on file  Occupational History  . Not on file  Tobacco Use  . Smoking status: Current Some Day Smoker    Years: 21.00    Types: Cigars    Start date: 2000  . Smokeless tobacco: Never Used  Substance and Sexual Activity  . Alcohol use: No  . Drug use: No  . Sexual activity: Not on file  Other Topics Concern  . Not on file  Social History Narrative  . Not on file   Social Determinants of Health   Financial Resource Strain:   . Difficulty of Paying Living Expenses:   Food Insecurity:   . Worried About Charity fundraiser in the Last Year:   . Arboriculturist in the Last Year:   Transportation Needs:   . Film/video editor (Medical):   Marland Kitchen Lack of Transportation (Non-Medical):   Physical Activity:   . Days of Exercise per Week:   . Minutes of Exercise per Session:   Stress:   . Feeling of Stress :   Social Connections:   . Frequency of Communication with Friends and Family:   . Frequency of Social Gatherings with Friends and Family:   . Attends Religious Services:   . Active Member of Clubs or  Organizations:   . Attends Archivist Meetings:   Marland Kitchen Marital Status:    Family History  Problem Relation Age of Onset  . Hypertension Mother   . Heart failure Mother   . Heart failure Maternal Grandmother   . Hypertension Maternal Grandmother    No Known Allergies Prior to Admission medications   Medication Sig Start Date End Date Taking? Authorizing Provider  acetaminophen (TYLENOL) 325 MG tablet Take 2 capsules by mouth every 4 (four) hours as needed.   Yes [provider]  aspirin EC 81 MG tablet Take 1 tablet (81 mg total) by mouth daily. 04/27/17  Yes Park Liter, MD  meloxicam (MOBIC) 7.5 MG tablet Take 1 tablet (7.5 mg total) by mouth daily. 10/26/17  Yes Bast, Traci A, NP     Positive ROS: Otherwise negative  All other systems have been reviewed and were otherwise negative with the exception of those mentioned in the HPI and as above.  Physical Exam: Constitutional: Alert, well-appearing, no acute distress Ears: External ears without lesions or tenderness.  Right ear canal right TM are clear.  Left ear canal reveals some wax as well as portions of the bug including the  wings and a few legs as well as a portion of his body that was removed with forceps and suction.  The TM was clear otherwise with no damage to the TM. Nasal: External nose without lesions. Septum mildly deviated to the left with a narrow left nasal passageway compared to the right.  After decongesting the nose there were no polyps noted.  Both middle meatus regions were clear.. Oral: Lips and gums without lesions. Tongue and palate mucosa without lesions. Posterior oropharynx clear. Neck: No palpable adenopathy or masses Cardiac exam: Regular rate and rhythm with a minimal murmur. Respiratory: Breathing comfortably.  Lungs clear to auscultation Skin: No facial/neck lesions or rash noted.  Cerumen impaction removal  Date/Time: 04/12/2019 3:00 PM Performed by: Drema Halon,  MD Authorized by: Drema Halon, MD   Consent:    Consent obtained:  Verbal   Consent given by:  Patient   Risks discussed:  Pain and bleeding Procedure details:    Location:  L ear   Procedure type: suction and forceps   Post-procedure details:    Inspection:  TM intact and canal normal   Hearing quality:  Improved   Patient tolerance of procedure:  Tolerated well, no immediate complications Comments:     There was wax and portions of a bug still lodged in the left ear canal that was removed with suction and forceps.  TMs were clear bilaterally.    Assessment: Left ear cerumen buildup as well as portions of a bug. Septal deviation to the left with left-sided nasal obstruction.  Plan: Discussed with the patient concerning use of nasal steroid spray which will help some with breathing.  He has tried these.  Also discussed surgical options which would include septoplasty and turbinate reductions and he would like to proceed with this.  Discussed the surgery with him.  He will talk to our nurse today about scheduling the surgery for his nasal obstruction.   Narda Bonds, MD   CC:

## 2019-04-13 ENCOUNTER — Encounter: Payer: Self-pay | Admitting: *Deleted

## 2019-04-13 NOTE — Progress Notes (Signed)
Chart abstract for new pt appt with Dr. Lucia Gaskins on 04/14/19. Source: notes from referring provider [name] PA.

## 2019-04-14 ENCOUNTER — Telehealth: Payer: Self-pay | Admitting: Neurology

## 2019-04-14 ENCOUNTER — Ambulatory Visit (INDEPENDENT_AMBULATORY_CARE_PROVIDER_SITE_OTHER): Payer: 59 | Admitting: Neurology

## 2019-04-14 ENCOUNTER — Encounter: Payer: Self-pay | Admitting: Neurology

## 2019-04-14 ENCOUNTER — Other Ambulatory Visit: Payer: Self-pay

## 2019-04-14 VITALS — BP 123/78 | HR 79 | Temp 97.4°F | Ht 72.0 in | Wt 195.0 lb

## 2019-04-14 DIAGNOSIS — G44009 Cluster headache syndrome, unspecified, not intractable: Secondary | ICD-10-CM | POA: Insufficient documentation

## 2019-04-14 DIAGNOSIS — R519 Headache, unspecified: Secondary | ICD-10-CM

## 2019-04-14 DIAGNOSIS — R51 Headache with orthostatic component, not elsewhere classified: Secondary | ICD-10-CM

## 2019-04-14 DIAGNOSIS — G44019 Episodic cluster headache, not intractable: Secondary | ICD-10-CM | POA: Insufficient documentation

## 2019-04-14 DIAGNOSIS — H5462 Unqualified visual loss, left eye, normal vision right eye: Secondary | ICD-10-CM

## 2019-04-14 DIAGNOSIS — G44021 Chronic cluster headache, intractable: Secondary | ICD-10-CM

## 2019-04-14 MED ORDER — PREDNISONE 20 MG PO TABS: ORAL_TABLET | ORAL | 0 refills | Status: DC

## 2019-04-14 MED ORDER — TOSYMRA 10 MG/ACT NA SOLN: 1.0000 | NASAL | 11 refills | Status: DC

## 2019-04-14 MED ORDER — VERAPAMIL HCL ER 120 MG PO TBCR: EXTENDED_RELEASE_TABLET | ORAL | 3 refills | Status: DC

## 2019-04-14 NOTE — Patient Instructions (Addendum)
- Will give him a high-dose steroid taper, for cluster headaches we have to use very high dose over 2 weeks - We will give him imitrex nasal spray, injections would be helpful as well or something that works very quickly such as injections - Will start Verapamil and titrate up - MRI of the brain to evaluate for intracerebral causes, especially in the thalamus and basal ganglia.    Cluster Headache Cluster headaches are deeply painful. They normally occur on one side of your head, but they may switch sides. Often, cluster headaches:  Are severe.  Happen often for a few weeks or months and then go away for a while.  Last from 15 minutes to 3 hours.  Happen at the same time each day.  Happen at night.  Happen many times a day. Follow these instructions at home:        Follow instructions from your doctor to care for yourself at home:  Go to bed at the same time each night. Get the same amount of sleep every night.  Avoid alcohol.  Stop smoking if you smoke. This includes cigarettes and e-cigarettes.  Take over-the-counter and prescription medicines only as told by your doctor.  Do not drive or use heavy machinery while taking prescription pain medicine.  Use oxygen as told by your doctor.  Exercise regularly.  Eat a healthy diet.  Write down when each headache happened, what kind of pain you had, how bad your pain was, and what you tried to help your pain. This is called a headache diary. Use it as told by your doctor. Contact a doctor if:  Your headaches get worse or they happen more often.  Your medicines are not helping. Get help right away if:  You pass out (faint).  You get weak or lose feeling (have numbness) on one side of your body or face.  You see two of everything (double vision).  You feel sick to your stomach (nauseous) or you throw up (vomit), and you do not stop after many hours.  You have trouble with your balance or with walking.  You have  trouble talking.  You have neck pain or stiffness.  You have a fever. This information is not intended to replace advice given to you by your health care provider. Make sure you discuss any questions you have with your health care provider. Document Revised: 10/27/2018 Document Reviewed: 09/07/2015 Elsevier Patient Education  2020 [employer]  Sumatriptan nasal spray What is this medicine? SUMATRIPTAN (soo ma TRIP tan) is used to treat migraines with or without aura. An aura is a strange feeling or visual disturbance that warns you of an attack. It is not used to prevent migraines. This medicine may be used for other purposes; ask your health care provider or pharmacist if you have questions. COMMON BRAND NAME(S): Imitrex, Tosymra What should I tell my health care provider before I take this medicine? They need to know if you have any of these conditions:  cigarette smoker  circulation problems in fingers and toes  diabetes  heart disease  high blood pressure  high cholesterol  history of irregular heartbeat  history of stroke  kidney disease  liver disease  stomach or intestine problems  an unusual or allergic reaction to sumatriptan, other medicines, foods, dyes, or preservatives  pregnant or trying to get pregnant  breast-feeding How should I use this medicine? This medicine is for use in the nose. Follow the directions on your product or prescription  label. Do not use more often than directed. Make sure that you are using your nasal spray correctly. Ask your doctor or health care professional if you have any questions. Talk to your pediatrician regarding the use of this medicine in children. Special care may be needed. Overdosage: If you think you have taken too much of this medicine contact a poison control center or emergency room at once. NOTE: This medicine is only for you. Do not share this medicine with others. What if I miss a dose? This does not apply.  This medicine is not for regular use. What may interact with this medicine? Do not take this medicine with any of the following medicines:  certain medicines for migraine headache like almotriptan, eletriptan, frovatriptan, naratriptan, rizatriptan, sumatriptan, zolmitriptan  ergot alkaloids like dihydroergotamine, ergonovine, ergotamine, methylergonovine  MAOIs like Carbex, Eldepryl, Marplan, Nardil, and Parnate This medicine may also interact with the following medications:  certain medicines for depression, anxiety, or psychotic disorders This list may not describe all possible interactions. Give your health care provider a list of all the medicines, herbs, non-prescription drugs, or dietary supplements you use. Also tell them if you smoke, drink alcohol, or use illegal drugs. Some items may interact with your medicine. What should I watch for while using this medicine? Visit your healthcare professional for regular checks on your progress. Tell your healthcare professional if your symptoms do not start to get better or if they get worse. You may get drowsy or dizzy. Do not drive, use machinery, or do anything that needs mental alertness until you know how this medicine affects you. Do not stand up or sit up quickly, especially if you are an older patient. This reduces the risk of dizzy or fainting spells. Alcohol may interfere with the effect of this medicine. Tell your healthcare professional right away if you have any change in your eyesight. If you take migraine medicines for 10 or more days a month, your migraines may get worse. Keep a diary of headache days and medicine use. Contact your healthcare professional if your migraine attacks occur more frequently. What side effects may I notice from receiving this medicine? Side effects that you should report to your doctor or health care professional as soon as possible:  allergic reactions like skin rash, itching or hives, swelling of the  face, lips, or tongue  changes in vision  chest pain or chest tightness  signs and symptoms of a dangerous change in heartbeat or heart rhythm like chest pain; dizziness; fast, irregular heartbeat; palpitations; feeling faint or lightheaded; falls; breathing problems  signs and symptoms of a stroke like changes in vision; confusion; trouble speaking or understanding; severe headaches; sudden numbness or weakness of the face, arm or leg; trouble walking; dizziness; loss of balance or coordination  signs and symptoms of serotonin syndrome like irritable; confusion; diarrhea; fast or irregular heartbeat; muscle twitching; stiff muscles; trouble walking; sweating; high fever; seizures; chills; vomiting Side effects that usually do not require medical attention (report to your doctor or health care professional if they continue or are bothersome):  changes in taste  diarrhea  dizziness  drowsiness  dry mouth  headache  nausea, vomiting  pain, tingling, numbness in the hands or feet  stomach pain This list may not describe all possible side effects. Call your doctor for medical advice about side effects. You may report side effects to FDA at 1-800-FDA-1088. Where should I keep my medicine? Keep out of the reach of children. Store at  room temperature between 2 and 30 degrees C (36 and 86 degrees F). Throw away any unused medicine after the expiration date. NOTE: This sheet is a summary. It may not cover all possible information. If you have questions about this medicine, talk to your doctor, pharmacist, or health care provider.  2020 Elsevier/Gold Standard (2017-07-14 15:08:07) Verapamil Sustained-Release Oral Capsules What is this medicine? VERAPAMIL (ver AP a mil) is a calcium channel blocker. It relaxes your blood vessels and decreases the amount of work the heart has to do. It treats high blood pressure. This medicine may be used for other purposes; ask your health care provider  or pharmacist if you have questions. COMMON BRAND NAME(S): Verelan, Verelan PM What should I tell my health care provider before I take this medicine? They need to know if you have any of these conditions:  Duchenne muscular dystrophy  heart disease  irregular heartbeat or rhythm  liver disease  low blood pressure  an unusual or allergic reaction to verapamil, other drugs, foods, dyes or preservatives  pregnant or trying to get pregnant  breast-feeding How should I use this medicine? Take this drug by mouth with water. Take it as directed on the prescription label at the same time every day. Do not cut, crush or chew this drug. Swallow the capsules whole. You may open the capsule and put the contents in 1 teaspoon of applesauce. Swallow the drug and applesauce right away. Do not chew the drug or applesauce. Keep taking it unless your health care provider tells you to stop. Do not take this drug with grapefruit juice. Talk to your health care provider about the use of this drug in children. Special care may be needed. Overdosage: If you think you have taken too much of this medicine contact a poison control center or emergency room at once. NOTE: This medicine is only for you. Do not share this medicine with others. What if I miss a dose? If you miss a dose, take it as soon as you can. If it is almost time for your next dose, take only that dose. Do not take double or extra doses. What may interact with this medicine? Do not take this medicine with any of the following:  cisapride  disopyramide  dofetilide  grapefruit juice  hawthorn  pimozide  red yeast rice This medicine may also interact with the following medications:  barbiturates such as phenobarbital  cimetidine  cyclosporine  lithium  local anesthetics or general anesthetics  medicines for heart rhythm problems like amiodarone, digoxin, flecainide, procainamide, quinidine  medicines for high blood  pressure or heart problems  medicines for seizures like carbamazepine and phenytoin  rifampin, rifabutin or rifapentine  theophylline or aminophylline This list may not describe all possible interactions. Give your health care provider a list of all the medicines, herbs, non-prescription drugs, or dietary supplements you use. Also tell them if you smoke, drink alcohol, or use illegal drugs. Some items may interact with your medicine. What should I watch for while using this medicine? Visit your health care provider for regular checks on your progress. Check your blood pressure as directed. Ask your health care provider what your blood pressure should be. Also, find out when you should contact him or her. Do not treat yourself for coughs, colds, or pain while you are using this drug without asking your health care provider for advice. Some drugs may increase your blood pressure. You may get drowsy or dizzy. Do not drive, use machinery,  or do anything that needs mental alertness until you know how this drug affects you. Do not stand up or sit up quickly, especially if you are an older patient. This reduces the risk of dizzy or fainting spells. What side effects may I notice from receiving this medicine? Side effects that you should report to your doctor or health care professional as soon as possible:  allergic reactions (skin rash, itching or hives; swelling of the face, lips, or tongue)  heart failure (trouble breathing; fast, irregular heartbeat; sudden weight gain; swelling of the ankles, feet, hands; unusually weak or tired)  heartbeat rhythm changes (trouble breathing; chest pain; dizziness; fast, irregular heartbeat; feeling faint or lightheaded, falls)  low blood pressure (dizziness; feeling faint or lightheaded, falls; unusually weak or tired) Side effects that usually do not require medical attention (report to your doctor or health care professional if they continue or are bothersome):   constipation  facial flushing  headache  nausea  stomach pain  unusual swelling This list may not describe all possible side effects. Call your doctor for medical advice about side effects. You may report side effects to FDA at 1-800-FDA-1088. Where should I keep my medicine? Keep out of the reach of children and pets. Store at room temperature between 20 and 25 degrees C (68 and 77 degrees F). Protect from moisture. Keep the container tightly closed. Avoid exposure to extreme heat. Throw away any unused drug after the expiration date. NOTE: This sheet is a summary. It may not cover all possible information. If you have questions about this medicine, talk to your doctor, pharmacist, or health care provider.  2020 Elsevier/Gold Standard (2018-10-27 12:02:33) Prednisone tablets What is this medicine? PREDNISONE (PRED ni sone) is a corticosteroid. It is commonly used to treat inflammation of the skin, joints, lungs, and other organs. Common conditions treated include asthma, allergies, and arthritis. It is also used for other conditions, such as blood disorders and diseases of the adrenal glands. This medicine may be used for other purposes; ask your health care provider or pharmacist if you have questions. COMMON BRAND NAME(S): Deltasone, Predone, Sterapred, Sterapred DS What should I tell my health care provider before I take this medicine? They need to know if you have any of these conditions:  Cushing's syndrome  diabetes  glaucoma  heart disease  high blood pressure  infection (especially a virus infection such as chickenpox, cold sores, or herpes)  kidney disease  liver disease  mental illness  myasthenia gravis  osteoporosis  seizures  stomach or intestine problems  thyroid disease  an unusual or allergic reaction to lactose, prednisone, other medicines, foods, dyes, or preservatives  pregnant or trying to get pregnant  breast-feeding How should I use  this medicine? Take this medicine by mouth with a glass of water. Follow the directions on the prescription label. Take this medicine with food. If you are taking this medicine once a day, take it in the morning. Do not take more medicine than you are told to take. Do not suddenly stop taking your medicine because you may develop a severe reaction. Your doctor will tell you how much medicine to take. If your doctor wants you to stop the medicine, the dose may be slowly lowered over time to avoid any side effects. Talk to your pediatrician regarding the use of this medicine in children. Special care may be needed. Overdosage: If you think you have taken too much of this medicine contact a poison control center or  emergency room at once. NOTE: This medicine is only for you. Do not share this medicine with others. What if I miss a dose? If you miss a dose, take it as soon as you can. If it is almost time for your next dose, talk to your doctor or health care professional. You may need to miss a dose or take an extra dose. Do not take double or extra doses without advice. What may interact with this medicine? Do not take this medicine with any of the following medications:  metyrapone  mifepristone This medicine may also interact with the following medications:  aminoglutethimide  amphotericin B  aspirin and aspirin-like medicines  barbiturates  certain medicines for diabetes, like glipizide or glyburide  cholestyramine  cholinesterase inhibitors  cyclosporine  digoxin  diuretics  ephedrine  male hormones, like estrogens and birth control pills  isoniazid  ketoconazole  NSAIDS, medicines for pain and inflammation, like ibuprofen or naproxen  phenytoin  rifampin  toxoids  vaccines  warfarin This list may not describe all possible interactions. Give your health care provider a list of all the medicines, herbs, non-prescription drugs, or dietary supplements you use.  Also tell them if you smoke, drink alcohol, or use illegal drugs. Some items may interact with your medicine. What should I watch for while using this medicine? Visit your doctor or health care professional for regular checks on your progress. If you are taking this medicine over a prolonged period, carry an identification card with your name and address, the type and dose of your medicine, and your doctor's name and address. This medicine may increase your risk of getting an infection. Tell your doctor or health care professional if you are around anyone with measles or chickenpox, or if you develop sores or blisters that do not heal properly. If you are going to have surgery, tell your doctor or health care professional that you have taken this medicine within the last twelve months. Ask your doctor or health care professional about your diet. You may need to lower the amount of salt you eat. This medicine may increase blood sugar. Ask your healthcare provider if changes in diet or medicines are needed if you have diabetes. What side effects may I notice from receiving this medicine? Side effects that you should report to your doctor or health care professional as soon as possible:  allergic reactions like skin rash, itching or hives, swelling of the face, lips, or tongue  changes in emotions or moods  changes in vision  depressed mood  eye pain  fever or chills, cough, sore throat, pain or difficulty passing urine  signs and symptoms of high blood sugar such as being more thirsty or hungry or having to urinate more than normal. You may also feel very tired or have blurry vision.  swelling of ankles, feet Side effects that usually do not require medical attention (report to your doctor or health care professional if they continue or are bothersome):  confusion, excitement, restlessness  headache  nausea, vomiting  skin problems, acne, thin and shiny skin  trouble sleeping   weight gain This list may not describe all possible side effects. Call your doctor for medical advice about side effects. You may report side effects to FDA at 1-800-FDA-1088. Where should I keep my medicine? Keep out of the reach of children. Store at room temperature between 15 and 30 degrees C (59 and 86 degrees F). Protect from light. Keep container tightly closed. Throw away any unused  medicine after the expiration date. NOTE: This sheet is a summary. It may not cover all possible information. If you have questions about this medicine, talk to your doctor, pharmacist, or health care provider.  2020 Elsevier/Gold Standard (2017-09-29 10:54:22)

## 2019-04-14 NOTE — Progress Notes (Signed)
[facility]    Provider:  Dr Jaynee Eagles Requesting Provider: Scifres, Richard Gonzalez Primary Care Provider:  Scifres, Richard Gonzalez  CC:  Migraines  HPI:  Richard Gonzalez is a 39 y.o. male here as requested by Richard Gonzalez, Richard Gonzalez, Richard Gonzalez for migraines.  Past medical history  I reviewed Richard Gonzalez' notes, patient with migraine without aura and without status migrainosus not intractable, it appears as though he is tried several preventatives and wanted to be referred to neurology, he is on Maxalt acutely, he complains of 1.5-year history of migraines, typically around the left eye, throbbing, headaches every day and last 30 minutes at a time [name] with associated nausea, also photophobia and phonophobia, he is unable to work when he gets the headaches they are so severe, sumatriptan hand did not work, he cut back on caffeine, headaches sometimes wake him up in the middle of the night, he was started on Topamax in June 2019 but he felt this was not effective, minimal relief by putting pressure on his head, he is a current smoker social alcohol use smokes marijuana.  He is here alone, they started about 1.5 years ago, unknown inciting events, severe, he gets them every day 1-3x a day, it is behind the left eye and in the temple area and in the back of the neck, throbbing and pulsing, rarely on the right, photophobia/phonophobia, solid pain and pressure, he wakes up in the middle of the night often and he can wake up in the morning with headaches,they start at 10amish or later at 4pm, unknown triggers, he stopped drinking red bulls and he stopped smoking. He is very tired and that's why he drinks red bull, also drinking coffee, he snores. He feels he has less energy. He has tried asa, motrin, tylenol and they don't work at all. Tried flexeril, mobic, naproxen, prednisone taper. The headaches only last 10 minutes - 20 minutes. Stabbing him in the eye, the whole left and his eye waters. Severe. He has  not tried the [employer]. He has left eye vision changes. No other focal neurologic deficits, associated symptoms, inciting events or modifiable factors.A trigger is alcohol. No other focal neurologic deficits, associated symptoms, inciting events or modifiable factors.Richard Gonzalez is here and also provides much information.   Reviewed notes, labs and imaging from outside physicians, which showed: bmp/cbc normal 2016  Review of Systems: Patient complains of symptoms per HPI as well as the following symptoms: migraine, headache. Pertinent negatives and positives per HPI. All others negative.   Social History   Socioeconomic History  . Marital status: Married    Spouse name: Not on file  . Number of children: 4  . Years of education: Not on file  . Highest education level: High school graduate  Occupational History  . Not on file  Tobacco Use  . Smoking status: Current Every Day Smoker    Years: 21.00    Types: Cigars    Start date: 2000  . Smokeless tobacco: Never Used  . Tobacco comment: up to 3 cigars per day, he has been trying to cut back  Substance and Sexual Activity  . Alcohol use: Yes    Comment: social  . Drug use: Yes    Frequency: 3.0 times per week    Types: Marijuana  . Sexual activity: Not on file  Other Topics Concern  . Not on file  Social History Narrative   Lives at home with Richard Gonzalez & children   Right handed   Caffeine: daily, coffee  and red bull. Maybe 2 (sometimes 3) red bulls per day and 2 cups of coffee per day   Social Determinants of Health   Financial Resource Strain:   . Difficulty of Paying Living Expenses:   Food Insecurity:   . Worried About Programme researcher, broadcasting/film/video in the Last Year:   . Barista in the Last Year:   Transportation Needs:   . Freight forwarder (Medical):   Richard Gonzalez Lack of Transportation (Non-Medical):   Physical Activity:   . Days of Exercise per Week:   . Minutes of Exercise per Session:   Stress:   . Feeling of Stress :   Social  Connections:   . Frequency of Communication with Friends and Family:   . Frequency of Social Gatherings with Friends and Family:   . Attends Religious Services:   . Active Member of Clubs or Organizations:   . Attends Banker Meetings:   Richard Gonzalez Marital Status:   Intimate Partner Violence:   . Fear of Current or Ex-Partner:   . Emotionally Abused:   Richard Gonzalez Physically Abused:   . Sexually Abused:     Family History  Problem Relation Age of Onset  . Hypertension Mother   . Heart failure Mother   . Headache Mother        bad   . Migraines Mother        bad   . Heart failure Maternal Grandmother   . Hypertension Maternal Grandmother     Past Medical History:  Diagnosis Date  . Cluster headache   . Heart murmur   . Migraine     Patient Active Problem List   Diagnosis Date Noted  . Cluster headache   . Mitral regurgitation 07/03/2017  . Palpitations 04/27/2017  . Smoking 04/27/2017  . Dyslipidemia 04/27/2017  . Heart murmur 04/27/2017  . Chest pain 04/23/2017    Past Surgical History:  Procedure Laterality Date  . SHOULDER SURGERY Right     Current Outpatient Medications  Medication Sig Dispense Refill  . acetaminophen (TYLENOL) 325 MG tablet Take 2 capsules by mouth every 4 (four) hours as needed.    Richard Gonzalez FERROUS SULFATE PO Take by mouth daily.    . predniSONE (DELTASONE) 20 MG tablet Take 80mg (4 tabs) for 7 days then decrease every day as follows: 60(3 tabs),60(3 tabs), 40(2 tabs),40(2 tabs) 20(1 tab),20 tab(1 tab) 10(1/2 tab), 10(1/2 tab). Total of 15 days. Take in the morning with food. 41 tablet 0  . SUMAtriptan (TOSYMRA) 10 MG/ACT SOLN Place 1 spray into the nose every hour. Wait at least one hour in between sprays for a max of 3 in one day. 6 each 11  . verapamil (CALAN-SR) 120 MG CR tablet Take 120mg (1 pill) at bedtime for 7 days then increase to 2 pills at bedtime 60 tablet 3   No current facility-administered medications for this visit.    Allergies as  of 04/14/2019  . (No Known Allergies)    Vitals: BP 123/78 (BP Location: Right Arm, Patient Position: Sitting)   Pulse 79   Temp (!) 97.4 F (36.3 C) Comment: taken at front  Ht 6' (1.829 m)   Wt 195 lb (88.5 kg)   BMI 26.45 kg/m  Last Weight:  Wt Readings from Last 1 Encounters:  04/14/19 195 lb (88.5 kg)   Last Height:   Ht Readings from Last 1 Encounters:  04/14/19 6' (1.829 m)     Physical exam: Exam: Gen: NAD, conversant, well  nourised, well groomed                     CV: RRR. No Carotid Bruits. No peripheral edema, warm, nontender Eyes: Conjunctivae clear without exudates or hemorrhage  Neuro: Detailed Neurologic Exam  Speech:    Speech is normal; fluent and spontaneous with normal comprehension.  Cognition:    The patient is oriented to person, place, and time;     recent and remote memory intact;     language fluent;     normal attention, concentration,     fund of knowledge Cranial Nerves:    The pupils are equal, round, and reactive to light. Attempted fundoscopy could not visualize. Visual fields are full to finger confrontation. Extraocular movements are intact. Trigeminal sensation is intact and the muscles of mastication are normal. The face is symmetric. The palate elevates in the midline. Hearing intact. Voice is normal. Shoulder shrug is normal. The tongue has normal motion without fasciculations.   Coordination:    No dysmetria Gait:   Normal native gait  Motor Observation:    No asymmetry, no atrophy, and no involuntary movements noted. Tone:    Normal muscle tone.    Posture:    Posture is normal. normal erect    Strength:    Strength is V/V in the upper and lower limbs.      Sensation: intact to LT     Reflex Exam:  DTR's:    Deep tendon reflexes in the upper and lower extremities are normal bilaterally.   Toes:    The toes are equivocal bilaterally.   Clonus:    Clonus is absent.    Assessment/Plan:  39 year old with  cluster headaches, he has severe headaches left side last 20 minutes, happens multiple times a day with migrainous features as well as autonomic symptoms(left eye watering), wakes him in the night, likely cluster headaches.  - Will give him a high-dose steroid taper, for cluster headaches we have to use very high dose over 2 weeks - We will give him imitrex nasal spray for [name], injections would be helpful as well or something that works very quickly - Will start Verapamil and titrate up - MRI of the brain to evaluate for cerebral causes, especially in the thalamus and basal ganglia.   Orders Placed This Encounter  Procedures  . MR BRAIN W WO CONTRAST  . CBC  . Comprehensive metabolic panel  . TSH   Meds ordered this encounter  Medications  . predniSONE (DELTASONE) 20 MG tablet    Sig: Take 80mg (4 tabs) for 7 days then decrease every day as follows: 60(3 tabs),60(3 tabs), 40(2 tabs),40(2 tabs) 20(1 tab),20 tab(1 tab) 10(1/2 tab), 10(1/2 tab). Total of 15 days. Take in the morning with food.    Dispense:  41 tablet    Refill:  0  . verapamil (CALAN-SR) 120 MG CR tablet    Sig: Take 120mg (1 pill) at bedtime for 7 days then increase to 2 pills at bedtime    Dispense:  60 tablet    Refill:  3  . SUMAtriptan (TOSYMRA) 10 MG/ACT SOLN    Sig: Place 1 spray into the nose every hour. Wait at least one hour in between sprays for a max of 3 in one day.    Dispense:  6 each    Refill:  11    Patient has copay card; he can have medication regardless of insurance approval or copay amount.  Cc: Richard Gonzalez, Dorothy, Richard Gonzalez  Naomie Dean, MD  [facility] [address] [place], [place] [address]  Phone [phone] Fax [phone]

## 2019-04-14 NOTE — Telephone Encounter (Signed)
[employer] auth: [id number] (exp. 04/14/19 to 10/14/19) order sent to GI. They will reach out to the patient to schedule.

## 2019-04-15 LAB — TSH: TSH: 0.73 u[IU]/mL (ref 0.450–4.500)

## 2019-04-15 LAB — COMPREHENSIVE METABOLIC PANEL
ALT: 15 IU/L (ref 0–44)
AST: 24 IU/L (ref 0–40)
Albumin/Globulin Ratio: 2 (ref 1.2–2.2)
Albumin: 4.9 g/dL (ref 4.0–5.0)
Alkaline Phosphatase: 68 IU/L (ref 39–117)
BUN/Creatinine Ratio: 12 (ref 9–20)
BUN: 11 mg/dL (ref 6–20)
Bilirubin Total: 0.4 mg/dL (ref 0.0–1.2)
CO2: 25 mmol/L (ref 20–29)
Calcium: 10.1 mg/dL (ref 8.7–10.2)
Chloride: 102 mmol/L (ref 96–106)
Creatinine, Ser: 0.93 mg/dL (ref 0.76–1.27)
GFR calc Af Amer: 122 mL/min/{1.73_m2} (ref >59)
GFR calc non Af Amer: 105 mL/min/{1.73_m2} (ref >59)
Globulin, Total: 2.4 g/dL (ref 1.5–4.5)
Glucose: 86 mg/dL (ref 65–99)
Potassium: 5.1 mmol/L (ref 3.5–5.2)
Sodium: 141 mmol/L (ref 134–144)
Total Protein: 7.3 g/dL (ref 6.0–8.5)

## 2019-04-15 LAB — CBC
Hematocrit: 46.2 % (ref 37.5–51.0)
Hemoglobin: 15.6 g/dL (ref 13.0–17.7)
MCH: 30.2 pg (ref 26.6–33.0)
MCHC: 33.8 g/dL (ref 31.5–35.7)
MCV: 90 fL (ref 79–97)
Platelets: 215 10*3/uL (ref 150–450)
RBC: 5.16 x10E6/uL (ref 4.14–5.80)
RDW: 13 % (ref 11.6–15.4)
WBC: 8.1 10*3/uL (ref 3.4–10.8)

## 2019-04-18 ENCOUNTER — Telehealth: Payer: Self-pay | Admitting: *Deleted

## 2019-04-18 NOTE — Telephone Encounter (Signed)
-----   Message from Anson Fret, MD sent at 04/18/2019  8:58 AM EDT ----- Labs normal thanks

## 2019-04-18 NOTE — Telephone Encounter (Signed)
Called pt & LVM asking for call back. When he calls back, please let him know his labs are normal.

## 2019-04-19 NOTE — Telephone Encounter (Signed)
I spoke with the pt and advised his labs are normal. Pt verbalized appreciation and did not have any questions. Pt made aware that GI had tried to reach him. He was given the # to call and schedule MRI. Pt verbalized appreciation for the call.

## 2019-05-03 ENCOUNTER — Encounter (HOSPITAL_BASED_OUTPATIENT_CLINIC_OR_DEPARTMENT_OTHER): Payer: Self-pay | Admitting: Otolaryngology

## 2019-05-03 ENCOUNTER — Other Ambulatory Visit: Payer: Self-pay

## 2019-05-03 NOTE — Progress Notes (Signed)
Per Dr. Hyacinth Meeker, patient will need to see cardiology before moving forward with surgical case.  Dr. Butch Penny office notified.

## 2019-05-06 ENCOUNTER — Telehealth: Payer: Self-pay | Admitting: Cardiology

## 2019-05-06 ENCOUNTER — Ambulatory Visit (INDEPENDENT_AMBULATORY_CARE_PROVIDER_SITE_OTHER): Payer: Self-pay | Admitting: Otolaryngology

## 2019-05-06 ENCOUNTER — Other Ambulatory Visit (HOSPITAL_COMMUNITY): Admission: RE | Admit: 2019-05-06 | Payer: 59 | Source: Ambulatory Visit

## 2019-05-06 DIAGNOSIS — J342 Deviated nasal septum: Secondary | ICD-10-CM

## 2019-05-06 NOTE — H&P (Signed)
PREOPERATIVE H&P  Chief Complaint: Chronic nasal obstruction  HPI: Richard Gonzalez is a 39 y.o. male who presents for evaluation of chronic nasal obstruction.  He has always had trouble breathing through his nose despite use of nasal steroid sprays.  His left side seems to be worse than the right side.  On exam in the office patient has a septal deviation to the left with moderate rhinitis and enlarged inferior turbinates bilaterally.  He is taken to the operating room at this time for septoplasty and bilateral inferior turbinate reductions.  Past Medical History:  Diagnosis Date  . Cluster headache   . Heart murmur   . Migraine    Past Surgical History:  Procedure Laterality Date  . SHOULDER SURGERY Right    Social History   Socioeconomic History  . Marital status: Married    Spouse name: Not on file  . Number of children: 4  . Years of education: Not on file  . Highest education level: High school graduate  Occupational History  . Not on file  Tobacco Use  . Smoking status: Current Every Day Smoker    Years: 21.00    Types: Cigars    Start date: 2000  . Smokeless tobacco: Never Used  . Tobacco comment: up to 3 cigars per day, he has been trying to cut back  Substance and Sexual Activity  . Alcohol use: Yes    Comment: social  . Drug use: Yes    Frequency: 3.0 times per week    Types: Marijuana  . Sexual activity: Not on file  Other Topics Concern  . Not on file  Social History Narrative   Lives at home with wife & children   Right handed   Caffeine: daily, coffee and red bull. Maybe 2 (sometimes 3) red bulls per day and 2 cups of coffee per day   Social Determinants of Health   Financial Resource Strain:   . Difficulty of Paying Living Expenses:   Food Insecurity:   . Worried About Programme researcher, broadcasting/film/video in the Last Year:   . Barista in the Last Year:   Transportation Needs:   . Freight forwarder (Medical):   Marland Kitchen Lack of Transportation (Non-Medical):    Physical Activity:   . Days of Exercise per Week:   . Minutes of Exercise per Session:   Stress:   . Feeling of Stress :   Social Connections:   . Frequency of Communication with Friends and Family:   . Frequency of Social Gatherings with Friends and Family:   . Attends Religious Services:   . Active Member of Clubs or Organizations:   . Attends Banker Meetings:   Marland Kitchen Marital Status:    Family History  Problem Relation Age of Onset  . Hypertension Mother   . Heart failure Mother   . Headache Mother        bad   . Migraines Mother        bad   . Heart failure Maternal Grandmother   . Hypertension Maternal Grandmother    No Known Allergies Prior to Admission medications   Medication Sig Start Date End Date Taking? Authorizing Provider  acetaminophen (TYLENOL) 325 MG tablet Take 2 capsules by mouth every 4 (four) hours as needed.    [provider]  SUMAtriptan (TOSYMRA) 10 MG/ACT SOLN Place 1 spray into the nose every hour. Wait at least one hour in between sprays for a max of 3 in  one day. 04/14/19   Melvenia Beam, MD  verapamil (CALAN-SR) 120 MG CR tablet Take 120mg (1 pill) at bedtime for 7 days then increase to 2 pills at bedtime 04/14/19   Melvenia Beam, MD     Positive ROS: Otherwise negative  All other systems have been reviewed and were otherwise negative with the exception of those mentioned in the HPI and as above.  Physical Exam: There were no vitals filed for this visit.  General: Alert, no acute distress Oral: Normal oral mucosa and tonsils Nasal: Septum deviated to the left with a large inferior turbinates and moderate mucosa swelling.  No polyps noted. Neck: No palpable adenopathy or thyroid nodules Ear: Ear canal is clear with normal appearing TMs Cardiovascular: Regular rate and rhythm, soft systolic murmur.  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

## 2019-05-06 NOTE — Telephone Encounter (Signed)
° °  [facility] Pre-operative Risk Assessment    Request for surgical clearance:  1. What type of surgery is being performed? Septoplasty with Bilateral Turbinate Reduction   2. When is this surgery scheduled? 05/10/19  3. What type of clearance is required (medical clearance vs. Pharmacy clearance to hold med vs. Both)? Both   4. Are there any medications that need to be held prior to surgery and how long? Whatever Dr. Agustin Cree advises  5. Practice name and name of physician performing surgery? Private Practice but [facility] affiliated, Dr. Rozetta Nunnery   6. What is your office phone number [phone]   7.   What is your office fax number [phone]  8.   Anesthesia type (None, local, MAC, general) ? General for 2 hours    Richard Gonzalez 05/06/2019, 9:42 AM  _________________________________________________________________   (provider comments below)

## 2019-05-06 NOTE — Telephone Encounter (Signed)
Primary Cardiologist:Robert Bing Matter, MD  Chart reviewed as part of pre-operative protocol coverage. Because of Richard Gonzalez's past medical history and time since last visit, he/she will require a follow-up visit in order to better assess preoperative cardiovascular risk.  Pre-op covering staff: - Please schedule appointment and call patient to inform them. - Please contact requesting surgeon's office via preferred method (i.e, phone, fax) to inform them of need for appointment prior to surgery.  If applicable, this message will also be routed to pharmacy pool and/or primary cardiologist for input on holding anticoagulant/antiplatelet agent as requested below so that this information is available at time of patient's appointment.   Ronney Asters, NP  05/06/2019, 9:54 AM

## 2019-05-06 NOTE — Telephone Encounter (Signed)
Pt agreeable to plan of care. Pt has been scheduled to see Thomasene Ripple, DO 05/09/19 @ 2:15 pm in the [place] office. I will send notes to Dr. Servando Salina for upcoming appt. I will send FYI to the surgeon pt has appt with cardiologist 05/09/19 for pore op clearance. I will remove from the pre op call back pool.

## 2019-05-06 NOTE — H&P (View-Only) (Signed)
PREOPERATIVE H&P  Chief Complaint: Chronic nasal obstruction  HPI: Richard Gonzalez is a 39 y.o. male who presents for evaluation of chronic nasal obstruction.  He has always had trouble breathing through his nose despite use of nasal steroid sprays.  His left side seems to be worse than the right side.  On exam in the office patient has a septal deviation to the left with moderate rhinitis and enlarged inferior turbinates bilaterally.  He is taken to the operating room at this time for septoplasty and bilateral inferior turbinate reductions.  Past Medical History:  Diagnosis Date  . Cluster headache   . Heart murmur   . Migraine    Past Surgical History:  Procedure Laterality Date  . SHOULDER SURGERY Right    Social History   Socioeconomic History  . Marital status: Married    Spouse name: Not on file  . Number of children: 4  . Years of education: Not on file  . Highest education level: High school graduate  Occupational History  . Not on file  Tobacco Use  . Smoking status: Current Every Day Smoker    Years: 21.00    Types: Cigars    Start date: 2000  . Smokeless tobacco: Never Used  . Tobacco comment: up to 3 cigars per day, he has been trying to cut back  Substance and Sexual Activity  . Alcohol use: Yes    Comment: social  . Drug use: Yes    Frequency: 3.0 times per week    Types: Marijuana  . Sexual activity: Not on file  Other Topics Concern  . Not on file  Social History Narrative   Lives at home with wife & children   Right handed   Caffeine: daily, coffee and red bull. Maybe 2 (sometimes 3) red bulls per day and 2 cups of coffee per day   Social Determinants of Health   Financial Resource Strain:   . Difficulty of Paying Living Expenses:   Food Insecurity:   . Worried About Programme researcher, broadcasting/film/video in the Last Year:   . Barista in the Last Year:   Transportation Needs:   . Freight forwarder (Medical):   Marland Kitchen Lack of Transportation (Non-Medical):    Physical Activity:   . Days of Exercise per Week:   . Minutes of Exercise per Session:   Stress:   . Feeling of Stress :   Social Connections:   . Frequency of Communication with Friends and Family:   . Frequency of Social Gatherings with Friends and Family:   . Attends Religious Services:   . Active Member of Clubs or Organizations:   . Attends Banker Meetings:   Marland Kitchen Marital Status:    Family History  Problem Relation Age of Onset  . Hypertension Mother   . Heart failure Mother   . Headache Mother        bad   . Migraines Mother        bad   . Heart failure Maternal Grandmother   . Hypertension Maternal Grandmother    No Known Allergies Prior to Admission medications   Medication Sig Start Date End Date Taking? Authorizing Provider  acetaminophen (TYLENOL) 325 MG tablet Take 2 capsules by mouth every 4 (four) hours as needed.    [provider]  SUMAtriptan (TOSYMRA) 10 MG/ACT SOLN Place 1 spray into the nose every hour. Wait at least one hour in between sprays for a max of 3 in  one day. 04/14/19   Ahern, Antonia B, MD  verapamil (CALAN-SR) 120 MG CR tablet Take 120mg(1 pill) at bedtime for 7 days then increase to 2 pills at bedtime 04/14/19   Ahern, Antonia B, MD     Positive ROS: Otherwise negative  All other systems have been reviewed and were otherwise negative with the exception of those mentioned in the HPI and as above.  Physical Exam: There were no vitals filed for this visit.  General: Alert, no acute distress Oral: Normal oral mucosa and tonsils Nasal: Septum deviated to the left with a large inferior turbinates and moderate mucosa swelling.  No polyps noted. Neck: No palpable adenopathy or thyroid nodules Ear: Ear canal is clear with normal appearing TMs Cardiovascular: Regular rate and rhythm, soft systolic murmur.  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  

## 2019-05-09 ENCOUNTER — Other Ambulatory Visit: Payer: Self-pay

## 2019-05-09 ENCOUNTER — Other Ambulatory Visit (HOSPITAL_COMMUNITY)
Admission: RE | Admit: 2019-05-09 | Discharge: 2019-05-09 | Disposition: A | Discharge status: Home / Self Care | Payer: 59 | Source: Ambulatory Visit | Attending: Otolaryngology | Admitting: Otolaryngology

## 2019-05-09 ENCOUNTER — Encounter: Payer: Self-pay | Admitting: Cardiology

## 2019-05-09 ENCOUNTER — Ambulatory Visit: Payer: Self-pay

## 2019-05-09 ENCOUNTER — Ambulatory Visit (INDEPENDENT_AMBULATORY_CARE_PROVIDER_SITE_OTHER): Payer: 59 | Admitting: Cardiology

## 2019-05-09 VITALS — BP 128/78 | HR 81 | Ht 72.0 in | Wt 194.0 lb

## 2019-05-09 DIAGNOSIS — E785 Hyperlipidemia, unspecified: Secondary | ICD-10-CM | POA: Diagnosis not present

## 2019-05-09 DIAGNOSIS — Z01812 Encounter for preprocedural laboratory examination: Secondary | ICD-10-CM | POA: Diagnosis present

## 2019-05-09 DIAGNOSIS — I340 Nonrheumatic mitral (valve) insufficiency: Secondary | ICD-10-CM

## 2019-05-09 DIAGNOSIS — Z20822 Contact with and (suspected) exposure to covid-19: Secondary | ICD-10-CM | POA: Diagnosis not present

## 2019-05-09 DIAGNOSIS — Z01810 Encounter for preprocedural cardiovascular examination: Secondary | ICD-10-CM | POA: Diagnosis not present

## 2019-05-09 DIAGNOSIS — R079 Chest pain, unspecified: Secondary | ICD-10-CM

## 2019-05-09 DIAGNOSIS — F17200 Nicotine dependence, unspecified, uncomplicated: Secondary | ICD-10-CM

## 2019-05-09 LAB — SARS CORONAVIRUS 2 (TAT 6-24 HRS): SARS Coronavirus 2: NEGATIVE

## 2019-05-09 NOTE — Anesthesia Preprocedure Evaluation (Addendum)
Anesthesia Evaluation  Patient identified by MRN, date of birth, ID band Patient awake    Reviewed: Allergy & Precautions, H&P , NPO status , Patient's Chart, lab work & pertinent test results  Airway Mallampati: I  TM Distance: >3 FB Neck ROM: Full    Dental no notable dental hx. (+) Teeth Intact, Dental Advisory Given, Chipped   Pulmonary neg pulmonary ROS, Current Smoker and Patient abstained from smoking.,    Pulmonary exam normal breath sounds clear to auscultation       Cardiovascular Exercise Tolerance: Good negative cardio ROS Normal cardiovascular exam+ Valvular Problems/Murmurs  Rhythm:Regular Rate:Normal     Neuro/Psych  Headaches, negative neurological ROS  negative psych ROS   GI/Hepatic negative GI ROS, Neg liver ROS,   Endo/Other  negative endocrine ROS  Renal/GU negative Renal ROS  negative genitourinary   Musculoskeletal negative musculoskeletal ROS (+)   Abdominal   Peds negative pediatric ROS (+)  Hematology negative hematology ROS (+)   Anesthesia Other Findings   Reproductive/Obstetrics negative OB ROS                            Anesthesia Physical Anesthesia Plan  ASA: II  Anesthesia Plan: General   Post-op Pain Management:    Induction: Intravenous  PONV Risk Score and Plan: 2 and Ondansetron and Dexamethasone  Airway Management Planned: Oral ETT and LMA  Additional Equipment:   Intra-op Plan:   Post-operative Plan: Extubation in OR  Informed Consent: I have reviewed the patients History and Physical, chart, labs and discussed the procedure including the risks, benefits and alternatives for the proposed anesthesia with the patient or authorized representative who has indicated his/her understanding and acceptance.       Plan Discussed with: Anesthesiologist, CRNA and Surgeon  Anesthesia Plan Comments: (  )       Anesthesia Quick  Evaluation

## 2019-05-09 NOTE — Progress Notes (Signed)
Instructed pt to go get covid retested at [address] due to speciman not collected at appropriate cite, also to come pick up soap, pt verbalized understanding.

## 2019-05-09 NOTE — Progress Notes (Signed)
Cardiology Office Note:    Date:  05/09/2019   ID:  Richard Gonzalez, DOB 09-Dec-1980, MRN [id number]  PCP:  [name], PA-C  Cardiologist:  Gypsy Balsam, MD  Electrophysiologist:  None   Referring MD: [name], PA-C   Chief Complaint  Patient presents with  . Pre-op Exam   History of Present Illness:    Richard Gonzalez is a 39 y.o. male with a hx of atypical chest pain calcium score is 0 after ambiguous stress test, moderate mitral regurgitation, dyslipidemia presents today to be evaluated for preoperative clearance.  The patient is planning a nasal septoplasty with bilateral turbinate reduction scheduled for tomorrow.  He tells me that he has been experiencing some shoulder pain as well as back pain which radiates to his upper chest, he notes that the pain is highly sensitive to motion and moving the hand up and down and back and forth depending on what position it is would relieve or increase the pain.  Past Medical History:  Diagnosis Date  . Cluster headache   . Heart murmur   . Migraine     Past Surgical History:  Procedure Laterality Date  . SHOULDER SURGERY Right     Current Medications: Current Meds  Medication Sig  . acetaminophen (TYLENOL) 325 MG tablet Take 2 capsules by mouth every 4 (four) hours as needed.     Allergies:   Patient has no known allergies.   Social History   Socioeconomic History  . Marital status: Married    Spouse name: Not on file  . Number of children: 4  . Years of education: Not on file  . Highest education level: High school graduate  Occupational History  . Not on file  Tobacco Use  . Smoking status: Current Every Day Smoker    Years: 21.00    Types: Cigars    Start date: 2000  . Smokeless tobacco: Never Used  . Tobacco comment: up to 3 cigars per day, he has been trying to cut back  Substance and Sexual Activity  . Alcohol use: Yes    Comment: social  . Drug use: Yes    Frequency: 3.0 times per week   Types: Marijuana  . Sexual activity: Not on file  Other Topics Concern  . Not on file  Social History Narrative   Lives at home with wife & children   Right handed   Caffeine: daily, coffee and red bull. Maybe 2 (sometimes 3) red bulls per day and 2 cups of coffee per day   Social Determinants of Health   Financial Resource Strain:   . Difficulty of Paying Living Expenses:   Food Insecurity:   . Worried About Programme researcher, broadcasting/film/video in the Last Year:   . Barista in the Last Year:   Transportation Needs:   . Freight forwarder (Medical):   Marland Kitchen Lack of Transportation (Non-Medical):   Physical Activity:   . Days of Exercise per Week:   . Minutes of Exercise per Session:   Stress:   . Feeling of Stress :   Social Connections:   . Frequency of Communication with Friends and Family:   . Frequency of Social Gatherings with Friends and Family:   . Attends Religious Services:   . Active Member of Clubs or Organizations:   . Attends Banker Meetings:   Marland Kitchen Marital Status:      Family History: The patient's family history includes Headache in his mother; Heart failure  in his maternal grandmother and mother; Hypertension in his maternal grandmother and mother; Migraines in his mother.  ROS:   Review of Systems  Constitution: Negative for decreased appetite, fever and weight gain.  HENT: Negative for congestion, ear discharge, hoarse voice and sore throat.   Eyes: Negative for discharge, redness, vision loss in right eye and visual halos.  Cardiovascular: Negative for chest pain, dyspnea on exertion, leg swelling, orthopnea and palpitations.  Respiratory: Negative for cough, hemoptysis, shortness of breath and snoring.   Endocrine: Negative for heat intolerance and polyphagia.  Hematologic/Lymphatic: Negative for bleeding problem. Does not bruise/bleed easily.  Skin: Negative for flushing, nail changes, rash and suspicious lesions.  Musculoskeletal: Negative for  arthritis, joint pain, muscle cramps, myalgias, neck pain and stiffness.  Gastrointestinal: Negative for abdominal pain, bowel incontinence, diarrhea and excessive appetite.  Genitourinary: Negative for decreased libido, genital sores and incomplete emptying.  Neurological: Negative for brief paralysis, focal weakness, headaches and loss of balance.  Psychiatric/Behavioral: Negative for altered mental status, depression and suicidal ideas.  Allergic/Immunologic: Negative for HIV exposure and persistent infections.    EKGs/Labs/Other Studies Reviewed:    The following studies were reviewed today:   EKG:  The ekg ordered today demonstrates sinus rhythm, heart rate 80 bpm.  Transthoracic echocardiogram 2019 Study Conclusions   - Left ventricle: The cavity size was normal. Systolic function was  normal. The estimated ejection fraction was in the range of 55%  to 60%. Wall motion was normal; there were no regional wall  motion abnormalities. Left ventricular diastolic function  parameters were normal.  - Mitral valve: There was mild to moderate regurgitation.    Calcium scoring June 26, 2017 Non-cardiac: See separate report from [facility].  Ascending Aorta: Normal size.  No calcifications.  Pericardium: Normal.  Coronary arteries: Normal origin.  IMPRESSION: Coronary calcium score of 0. This was 0 percentile for age and sex matched control.   Recent Labs: 04/14/2019: ALT 15; BUN 11; Creatinine, Ser 0.93; Hemoglobin 15.6; Platelets 215; Potassium 5.1; Sodium 141; TSH 0.730  Recent Lipid Panel No results found for: CHOL, TRIG, HDL, CHOLHDL, VLDL, LDLCALC, LDLDIRECT  Physical Exam:    VS:  BP 128/78   Pulse 81   Ht 6' (1.829 m)   Wt 194 lb (88 kg)   SpO2 97%   BMI 26.31 kg/m     Wt Readings from Last 3 Encounters:  05/09/19 194 lb (88 kg)  04/14/19 195 lb (88.5 kg)  07/03/17 182 lb 1.9 oz (82.6 kg)     GEN: Well nourished, well developed in  no acute distress HEENT: Normal NECK: No JVD; No carotid bruits LYMPHATICS: No lymphadenopathy CARDIAC: S1S2 noted,RRR, no murmurs, rubs, gallops RESPIRATORY:  Clear to auscultation without rales, wheezing or rhonchi  ABDOMEN: Soft, non-tender, non-distended, +bowel sounds, no guarding. EXTREMITIES: No edema, No cyanosis, no clubbing MUSCULOSKELETAL:  No deformity, reproducible pain in hands trapezius as well as pectoralis muscles SKIN: Warm and dry NEUROLOGIC:  Alert and oriented x 3, non-focal PSYCHIATRIC:  Normal affect, good insight  ASSESSMENT:    1. Preoperative cardiovascular examination   2. Musculoskeletal chest pain   3. Nonrheumatic mitral valve regurgitation   4. Dyslipidemia   5. Smoking    PLAN:     Clinical exam highly suggest that his pain reported was musculoskeletal.  He has had calcium scoring CT which reported 0 calcium.  He does not report any anginal symptoms.  The patient does not have any unstable cardiac conditions.  Upon evaluation today, he can achieve 4 METs or greater without anginal symptoms.  According to [facility] and AHA guidelines, he requires no further cardiac workup prior to his noncardiac surgery and should be at acceptable risk.  Our service is available as necessary in the perioperative period.  Mild to moderate mitral regurgitation-no signs of heart failure.  We will continue to monitor.  Tobacco use-smoking cessation advised  The patient is in agreement with the above plan. The patient left the office in stable condition.  The patient will follow up in 3 months with Dr. Bing Matter.   Medication Adjustments/Labs and Tests Ordered: Current medicines are reviewed at length with the patient today.  Concerns regarding medicines are outlined above.  Orders Placed This Encounter  Procedures  . EKG 12-Lead   No orders of the defined types were placed in this encounter.   Patient Instructions  Medication Instructions:  Your physician recommends  that you continue on your current medications as directed. Please refer to the Current Medication list given to you today.  *If you need a refill on your cardiac medications before your next appointment, please call your pharmacy*   Lab Work: None.  If you have labs (blood work) drawn today and your tests are completely normal, you will receive your results only by: Marland Kitchen MyChart Message (if you have MyChart) OR . A paper copy in the mail If you have any lab test that is abnormal or we need to change your treatment, we will call you to review the results.   Testing/Procedures: None.    Follow-Up: At [facility], you and your health needs are our priority.  As part of our continuing mission to provide you with exceptional heart care, we have created designated Provider Care Teams.  These Care Teams include your primary Cardiologist (physician) and Advanced Practice Providers (APPs -  Physician Assistants and Nurse Practitioners) who all work together to provide you with the care you need, when you need it.  We recommend signing up for the patient portal called "MyChart".  Sign up information is provided on this After Visit Summary.  MyChart is used to connect with patients for Virtual Visits (Telemedicine).  Patients are able to view lab/test results, encounter notes, upcoming appointments, etc.  Non-urgent messages can be sent to your provider as well.   To learn more about what you can do with MyChart, go to [email or web address].    Your next appointment:   3 month(s)  The format for your next appointment:   In Person  Provider:   Gypsy Balsam, MD   Other Instructions      Adopting a Healthy Lifestyle.  Know what a healthy weight is for you (roughly BMI <25) and aim to maintain this   Aim for 7+ servings of fruits and vegetables daily   65-80+ fluid ounces of water or unsweet tea for healthy kidneys   Limit to max 1 drink of alcohol per day; avoid  smoking/tobacco   Limit animal fats in diet for cholesterol and heart health - choose grass fed whenever available   Avoid highly processed foods, and foods high in saturated/trans fats   Aim for low stress - take time to unwind and care for your mental health   Aim for 150 min of moderate intensity exercise weekly for heart health, and weights twice weekly for bone health   Aim for 7-9 hours of sleep daily   When it comes to diets, agreement about the perfect plan isnt easy  to find, even among the experts. Experts at the [facility] [employer] developed an idea known as the Healthy Eating Plate. Just imagine a plate divided into logical, healthy portions.   The emphasis is on diet quality:   Load up on vegetables and fruits - one-half of your plate: Aim for color and variety, and remember that potatoes dont count.   Go for whole grains - one-quarter of your plate: Whole wheat, barley, wheat berries, quinoa, oats, brown rice, and foods made with them. If you want pasta, go with whole wheat pasta.   Protein power - one-quarter of your plate: Fish, chicken, beans, and nuts are all healthy, versatile protein sources. Limit red meat.   The diet, however, does go beyond the plate, offering a few other suggestions.   Use healthy plant oils, such as olive, canola, soy, corn, sunflower and peanut. Check the labels, and avoid partially hydrogenated oil, which have unhealthy trans fats.   If youre thirsty, drink water. Coffee and tea are good in moderation, but skip sugary drinks and limit milk and dairy products to one or two daily servings.   The type of carbohydrate in the diet is more important than the amount. Some sources of carbohydrates, such as vegetables, fruits, whole grains, and beans-are healthier than others.   Finally, stay active  Signed, Thomasene Ripple, DO  05/09/2019 2:59 PM    [facility]

## 2019-05-09 NOTE — Patient Instructions (Signed)

## 2019-05-10 ENCOUNTER — Ambulatory Visit (HOSPITAL_BASED_OUTPATIENT_CLINIC_OR_DEPARTMENT_OTHER)
Admission: RE | Admit: 2019-05-10 | Discharge: 2019-05-10 | Disposition: A | Discharge status: Home / Self Care | Payer: 59 | Attending: Otolaryngology | Admitting: Otolaryngology

## 2019-05-10 ENCOUNTER — Ambulatory Visit (INDEPENDENT_AMBULATORY_CARE_PROVIDER_SITE_OTHER): Payer: 59 | Admitting: Otolaryngology

## 2019-05-10 ENCOUNTER — Encounter (HOSPITAL_BASED_OUTPATIENT_CLINIC_OR_DEPARTMENT_OTHER): Payer: Self-pay | Admitting: Otolaryngology

## 2019-05-10 ENCOUNTER — Ambulatory Visit (HOSPITAL_BASED_OUTPATIENT_CLINIC_OR_DEPARTMENT_OTHER): Payer: 59 | Admitting: Anesthesiology

## 2019-05-10 ENCOUNTER — Other Ambulatory Visit: Payer: Self-pay

## 2019-05-10 ENCOUNTER — Encounter (HOSPITAL_BASED_OUTPATIENT_CLINIC_OR_DEPARTMENT_OTHER)
Admission: RE | Disposition: A | Discharge status: Home / Self Care | Payer: Self-pay | Source: Home / Self Care | Attending: Otolaryngology

## 2019-05-10 VITALS — Temp 97.9°F

## 2019-05-10 DIAGNOSIS — Z79899 Other long term (current) drug therapy: Secondary | ICD-10-CM | POA: Insufficient documentation

## 2019-05-10 DIAGNOSIS — J343 Hypertrophy of nasal turbinates: Secondary | ICD-10-CM | POA: Diagnosis not present

## 2019-05-10 DIAGNOSIS — J3489 Other specified disorders of nose and nasal sinuses: Secondary | ICD-10-CM | POA: Insufficient documentation

## 2019-05-10 DIAGNOSIS — F17290 Nicotine dependence, other tobacco product, uncomplicated: Secondary | ICD-10-CM | POA: Insufficient documentation

## 2019-05-10 DIAGNOSIS — G43909 Migraine, unspecified, not intractable, without status migrainosus: Secondary | ICD-10-CM | POA: Insufficient documentation

## 2019-05-10 DIAGNOSIS — Z4889 Encounter for other specified surgical aftercare: Secondary | ICD-10-CM

## 2019-05-10 DIAGNOSIS — J342 Deviated nasal septum: Secondary | ICD-10-CM | POA: Diagnosis not present

## 2019-05-10 HISTORY — PX: NASAL SEPTOPLASTY W/ TURBINOPLASTY: SHX2070

## 2019-05-10 SURGERY — SEPTOPLASTY, NOSE, WITH NASAL TURBINATE REDUCTION
Anesthesia: General | Site: Nose | Laterality: Bilateral

## 2019-05-10 MED ORDER — SUGAMMADEX SODIUM 500 MG/5ML IV SOLN
INTRAVENOUS | Status: DC | PRN
  Administered 2019-05-10: 200 mg via INTRAVENOUS

## 2019-05-10 MED ORDER — FENTANYL CITRATE (PF) 100 MCG/2ML IJ SOLN
25.0000 ug | INTRAMUSCULAR | Status: DC | PRN
  Administered 2019-05-10 (×2): 50 ug via INTRAVENOUS

## 2019-05-10 MED ORDER — ACETAMINOPHEN 325 MG PO TABS: 325.0000 mg | ORAL_TABLET | ORAL | Status: DC | PRN

## 2019-05-10 MED ORDER — MEPERIDINE HCL 25 MG/ML IJ SOLN: 6.2500 mg | INTRAMUSCULAR | Status: DC | PRN

## 2019-05-10 MED ORDER — ACETAMINOPHEN 160 MG/5ML PO SOLN: 325.0000 mg | ORAL | Status: DC | PRN

## 2019-05-10 MED ORDER — FENTANYL CITRATE (PF) 100 MCG/2ML IJ SOLN
INTRAMUSCULAR | Status: DC | PRN
  Administered 2019-05-10 (×2): 50 ug via INTRAVENOUS
  Administered 2019-05-10: 100 ug via INTRAVENOUS

## 2019-05-10 MED ORDER — ONDANSETRON HCL 4 MG/2ML IJ SOLN
INTRAMUSCULAR | Status: AC
  Filled 2019-05-10: qty 2

## 2019-05-10 MED ORDER — FENTANYL CITRATE (PF) 100 MCG/2ML IJ SOLN: 25.0000 ug | INTRAMUSCULAR | Status: DC | PRN

## 2019-05-10 MED ORDER — LIDOCAINE 2% (20 MG/ML) 5 ML SYRINGE
INTRAMUSCULAR | Status: AC
  Filled 2019-05-10: qty 5

## 2019-05-10 MED ORDER — ONDANSETRON HCL 4 MG/2ML IJ SOLN: 4.0000 mg | Freq: Once | INTRAMUSCULAR | Status: DC | PRN

## 2019-05-10 MED ORDER — SODIUM CHLORIDE (PF) 0.9 % IJ SOLN
INTRAMUSCULAR | Status: AC
  Filled 2019-05-10: qty 10

## 2019-05-10 MED ORDER — MUPIROCIN 2 % EX OINT
TOPICAL_OINTMENT | CUTANEOUS | Status: AC
  Filled 2019-05-10: qty 22

## 2019-05-10 MED ORDER — CEFAZOLIN SODIUM-DEXTROSE 2-4 GM/100ML-% IV SOLN
2.0000 g | INTRAVENOUS | Status: AC
  Administered 2019-05-10: 2 g via INTRAVENOUS

## 2019-05-10 MED ORDER — SODIUM CHLORIDE 0.9 % IV SOLN
INTRAVENOUS | Status: AC | PRN
  Administered 2019-05-10: 100 mL

## 2019-05-10 MED ORDER — HYDROCODONE-ACETAMINOPHEN 5-325 MG PO TABS: 1.0000 | ORAL_TABLET | Freq: Four times a day (QID) | ORAL | 0 refills | Status: DC | PRN

## 2019-05-10 MED ORDER — MIDAZOLAM HCL 5 MG/5ML IJ SOLN
INTRAMUSCULAR | Status: DC | PRN
  Administered 2019-05-10: 2 mg via INTRAVENOUS

## 2019-05-10 MED ORDER — OXYCODONE HCL 5 MG PO TABS: 5.0000 mg | ORAL_TABLET | Freq: Once | ORAL | Status: DC | PRN

## 2019-05-10 MED ORDER — OXYCODONE HCL 5 MG/5ML PO SOLN: 5.0000 mg | Freq: Once | ORAL | Status: DC | PRN

## 2019-05-10 MED ORDER — DIPHENHYDRAMINE HCL 50 MG/ML IJ SOLN
INTRAMUSCULAR | Status: DC | PRN
  Administered 2019-05-10: 12.5 mg via INTRAVENOUS

## 2019-05-10 MED ORDER — FENTANYL CITRATE (PF) 100 MCG/2ML IJ SOLN
INTRAMUSCULAR | Status: AC
  Filled 2019-05-10: qty 2

## 2019-05-10 MED ORDER — LIDOCAINE-EPINEPHRINE 1 %-1:100000 IJ SOLN
INTRAMUSCULAR | Status: AC
  Filled 2019-05-10: qty 1

## 2019-05-10 MED ORDER — METHYLPREDNISOLONE ACETATE 80 MG/ML IJ SUSP
INTRAMUSCULAR | Status: AC
  Filled 2019-05-10: qty 1

## 2019-05-10 MED ORDER — OXYMETAZOLINE HCL 0.05 % NA SOLN
NASAL | Status: AC
  Filled 2019-05-10: qty 30

## 2019-05-10 MED ORDER — LIDOCAINE-EPINEPHRINE 1 %-1:100000 IJ SOLN
INTRAMUSCULAR | Status: DC | PRN
  Administered 2019-05-10: 2 mL
  Administered 2019-05-10: 7 mL

## 2019-05-10 MED ORDER — LIDOCAINE HCL (CARDIAC) PF 100 MG/5ML IV SOSY
PREFILLED_SYRINGE | INTRAVENOUS | Status: DC | PRN
  Administered 2019-05-10: 100 mg via INTRAVENOUS

## 2019-05-10 MED ORDER — ROCURONIUM BROMIDE 100 MG/10ML IV SOLN
INTRAVENOUS | Status: DC | PRN
  Administered 2019-05-10: 50 mg via INTRAVENOUS

## 2019-05-10 MED ORDER — DEXAMETHASONE SODIUM PHOSPHATE 10 MG/ML IJ SOLN
INTRAMUSCULAR | Status: AC
  Filled 2019-05-10: qty 1

## 2019-05-10 MED ORDER — PROPOFOL 10 MG/ML IV BOLUS
INTRAVENOUS | Status: DC | PRN
  Administered 2019-05-10: 200 mg via INTRAVENOUS

## 2019-05-10 MED ORDER — DIPHENHYDRAMINE HCL 50 MG/ML IJ SOLN
INTRAMUSCULAR | Status: AC
  Filled 2019-05-10: qty 1

## 2019-05-10 MED ORDER — CEFAZOLIN SODIUM-DEXTROSE 2-4 GM/100ML-% IV SOLN
INTRAVENOUS | Status: AC
  Filled 2019-05-10: qty 100

## 2019-05-10 MED ORDER — CEPHALEXIN 500 MG PO CAPS: 500.0000 mg | ORAL_CAPSULE | Freq: Two times a day (BID) | ORAL | 0 refills | Status: DC

## 2019-05-10 MED ORDER — OXYCODONE HCL 5 MG/5ML PO SOLN: 5.0000 mg | Freq: Once | ORAL | Status: AC | PRN

## 2019-05-10 MED ORDER — MUPIROCIN 2 % EX OINT
TOPICAL_OINTMENT | CUTANEOUS | Status: DC | PRN
  Administered 2019-05-10: 1 via NASAL

## 2019-05-10 MED ORDER — CHLORHEXIDINE GLUCONATE CLOTH 2 % EX PADS: 6.0000 | MEDICATED_PAD | Freq: Once | CUTANEOUS | Status: DC

## 2019-05-10 MED ORDER — SUGAMMADEX SODIUM 500 MG/5ML IV SOLN
INTRAVENOUS | Status: AC
  Filled 2019-05-10: qty 5

## 2019-05-10 MED ORDER — MIDAZOLAM HCL 2 MG/2ML IJ SOLN
INTRAMUSCULAR | Status: AC
  Filled 2019-05-10: qty 2

## 2019-05-10 MED ORDER — DEXAMETHASONE SODIUM PHOSPHATE 4 MG/ML IJ SOLN
INTRAMUSCULAR | Status: DC | PRN
  Administered 2019-05-10: 10 mg via INTRAVENOUS

## 2019-05-10 MED ORDER — EPHEDRINE 5 MG/ML INJ
INTRAVENOUS | Status: AC
  Filled 2019-05-10: qty 10

## 2019-05-10 MED ORDER — PROPOFOL 500 MG/50ML IV EMUL
INTRAVENOUS | Status: AC
  Filled 2019-05-10: qty 50

## 2019-05-10 MED ORDER — ONDANSETRON HCL 4 MG/2ML IJ SOLN
INTRAMUSCULAR | Status: DC | PRN
  Administered 2019-05-10: 4 mg via INTRAVENOUS

## 2019-05-10 MED ORDER — OXYCODONE HCL 5 MG PO TABS
ORAL_TABLET | ORAL | Status: AC
  Filled 2019-05-10: qty 1

## 2019-05-10 MED ORDER — OXYCODONE HCL 5 MG PO TABS
5.0000 mg | ORAL_TABLET | Freq: Once | ORAL | Status: AC | PRN
  Administered 2019-05-10: 10:00:00 5 mg via ORAL

## 2019-05-10 MED ORDER — LACTATED RINGERS IV SOLN: INTRAVENOUS | Status: DC

## 2019-05-10 MED ORDER — DEXMEDETOMIDINE HCL 200 MCG/2ML IV SOLN
INTRAVENOUS | Status: DC | PRN
  Administered 2019-05-10: 20 ug via INTRAVENOUS

## 2019-05-10 MED ORDER — PHENYLEPHRINE 40 MCG/ML (10ML) SYRINGE FOR IV PUSH (FOR BLOOD PRESSURE SUPPORT)
PREFILLED_SYRINGE | INTRAVENOUS | Status: AC
  Filled 2019-05-10: qty 10

## 2019-05-10 MED ORDER — SUCCINYLCHOLINE CHLORIDE 200 MG/10ML IV SOSY
PREFILLED_SYRINGE | INTRAVENOUS | Status: AC
  Filled 2019-05-10: qty 10

## 2019-05-10 MED ORDER — OXYMETAZOLINE HCL 0.05 % NA SOLN
NASAL | Status: DC | PRN
  Administered 2019-05-10: 1 via TOPICAL

## 2019-05-10 SURGICAL SUPPLY — 40 items
ATTRACTOMAT 16X20 MAGNETIC DRP (DRAPES) ×2 IMPLANT
BLADE INF TURB ROT M4 2 5PK (BLADE) ×2 IMPLANT
CANISTER SUCT 1200ML W/VALVE (MISCELLANEOUS) ×2 IMPLANT
COAGULATOR SUCT 8FR VV (MISCELLANEOUS) ×2 IMPLANT
COVER WAND RF STERILE (DRAPES) IMPLANT
DECANTER SPIKE VIAL GLASS SM (MISCELLANEOUS) ×2 IMPLANT
DRSG TELFA 3X8 NADH (GAUZE/BANDAGES/DRESSINGS) ×2 IMPLANT
ELECT REM PT RETURN 9FT ADLT (ELECTROSURGICAL) ×2
ELECTRODE REM PT RTRN 9FT ADLT (ELECTROSURGICAL) ×1 IMPLANT
GLOVE BIO SURGEON STRL SZ7 (GLOVE) ×2 IMPLANT
GLOVE BIOGEL PI IND STRL 7.0 (GLOVE) ×1 IMPLANT
GLOVE BIOGEL PI INDICATOR 7.0 (GLOVE) ×1
GLOVE SS BIOGEL STRL SZ 7.5 (GLOVE) ×1 IMPLANT
GLOVE SUPERSENSE BIOGEL SZ 7.5 (GLOVE) ×1
GOWN STRL REUS W/ TWL LRG LVL3 (GOWN DISPOSABLE) ×1 IMPLANT
GOWN STRL REUS W/ TWL XL LVL3 (GOWN DISPOSABLE) ×1 IMPLANT
GOWN STRL REUS W/TWL LRG LVL3 (GOWN DISPOSABLE) ×1
GOWN STRL REUS W/TWL XL LVL3 (GOWN DISPOSABLE) ×1
IV NS 500ML (IV SOLUTION) ×1
IV NS 500ML BAXH (IV SOLUTION) ×1 IMPLANT
NEEDLE PRECISIONGLIDE 27X1.5 (NEEDLE) ×2 IMPLANT
NS IRRIG 1000ML POUR BTL (IV SOLUTION) ×2 IMPLANT
PACK ENT DAY SURGERY (CUSTOM PROCEDURE TRAY) ×2 IMPLANT
PATTIES SURGICAL .5 X3 (DISPOSABLE) ×2 IMPLANT
SET BASIN DAY SURGERY F.S. (CUSTOM PROCEDURE TRAY) ×2 IMPLANT
SHEET SILICONE 2X3 0.02 REINF (MISCELLANEOUS) IMPLANT
SHEET SILICONE 2X3 0.03 REINF (MISCELLANEOUS) IMPLANT
SLEEVE SCD COMPRESS KNEE MED (MISCELLANEOUS) ×2 IMPLANT
SPLINT NASAL AIRWAY SILICONE (MISCELLANEOUS) IMPLANT
SPONGE GAUZE 2X2 8PLY STRL LF (GAUZE/BANDAGES/DRESSINGS) ×2 IMPLANT
SUT CHROMIC 4 0 PS 2 18 (SUTURE) ×2 IMPLANT
SUT ETHILON 3 0 PS 1 (SUTURE) IMPLANT
SUT SILK 2 0 PERMA HAND 18 BK (SUTURE) ×2 IMPLANT
SUT VIC AB 4-0 P-3 18XBRD (SUTURE) IMPLANT
SUT VIC AB 4-0 P3 18 (SUTURE)
SYR 3ML 18GX1 1/2 (SYRINGE) IMPLANT
TOWEL GREEN STERILE FF (TOWEL DISPOSABLE) ×4 IMPLANT
TRAY DSU PREP LF (CUSTOM PROCEDURE TRAY) ×2 IMPLANT
TUBE CONNECTING 20X1/4 (TUBING) ×2 IMPLANT
YANKAUER SUCT BULB TIP NO VENT (SUCTIONS) ×2 IMPLANT

## 2019-05-10 NOTE — Anesthesia Procedure Notes (Addendum)
Procedure Name: Intubation Date/Time: 05/10/2019 7:35 AM Performed by: Willa Frater, CRNA Pre-anesthesia Checklist: Patient identified, Emergency Drugs available, Suction available and Patient being monitored Patient Re-evaluated:Patient Re-evaluated prior to induction Oxygen Delivery Method: Circle system utilized Preoxygenation: Pre-oxygenation with 100% oxygen Induction Type: IV induction Ventilation: Mask ventilation without difficulty Laryngoscope Size: Mac and 4 Grade View: Grade II Tube type: Oral Number of attempts: 2 Airway Equipment and Method: Stylet and Oral airway Placement Confirmation: ETT inserted through vocal cords under direct vision,  positive ETCO2 and breath sounds checked- equal and bilateral Secured at: 23 cm Tube secured with: Tape Dental Injury: Teeth and Oropharynx as per pre-operative assessment

## 2019-05-10 NOTE — Anesthesia Postprocedure Evaluation (Signed)
Anesthesia Post Note  Patient: Richard Gonzalez  Procedure(s) Performed: NASAL SEPTOPLASTY WITH BILATERAL TURBINATE REDUCTION (Bilateral Nose)     Patient location during evaluation: PACU Anesthesia Type: General Level of consciousness: awake and alert Pain management: pain level controlled Vital Signs Assessment: post-procedure vital signs reviewed and stable Respiratory status: spontaneous breathing, nonlabored ventilation, respiratory function stable and patient connected to nasal cannula oxygen Cardiovascular status: blood pressure returned to baseline and stable Postop Assessment: no apparent nausea or vomiting Anesthetic complications: no    Last Vitals:  Vitals:   05/10/19 0952 05/10/19 1006  BP: (!) 148/95 (!) 132/92  Pulse: 76   Resp: 14 17  Temp:  36.5 C  SpO2: 100% 100%    Last Pain:  Vitals:   05/10/19 1006  TempSrc: Oral  PainSc: 6                  Beyza Bellino

## 2019-05-10 NOTE — Discharge Instructions (Addendum)
  Post Anesthesia Home Care Instructions  Activity: Get plenty of rest for the remainder of the day. A responsible individual must stay with you for 24 hours following the procedure.  For the next 24 hours, DO NOT: -Drive a car -Advertising copywriter -Drink alcoholic beverages -Take any medication unless instructed by your physician -Make any legal decisions or sign important papers.  Meals: Start with liquid foods such as gelatin or soup. Progress to regular foods as tolerated. Avoid greasy, spicy, heavy foods. If nausea and/or vomiting occur, drink only clear liquids until the nausea and/or vomiting subsides. Call your physician if vomiting continues.  Special Instructions/Symptoms: Your throat may feel dry or sore from the anesthesia or the breathing tube placed in your throat during surgery. If this causes discomfort, gargle with warm salt water. The discomfort should disappear within 24 hours.      Elevate head of bed and apply cool compress to nose to reduce swelling and bleeding. Tylenol, ibuprofen or hydrocodone 5 mg every 6 hrs prn pain Keflex 500 mg bid  Start tonight Return to [facility] office tomorrow at 11:45 to have nasal packs removed

## 2019-05-10 NOTE — Interval H&P Note (Signed)
History and Physical Interval Note:  05/10/2019 7:28 AM  Richard Gonzalez  has presented today for surgery, with the diagnosis of DEVIATED SEPTUM AND NASAL TURBINATE HYPERTROPHY.  The various methods of treatment have been discussed with the patient and family. After consideration of risks, benefits and other options for treatment, the patient has consented to  Procedure(s): NASAL SEPTOPLASTY WITH BILATERAL TURBINATE REDUCTION (Bilateral) as a surgical intervention.  The patient's history has been reviewed, patient examined, no change in status, stable for surgery.  I have reviewed the patient's chart and labs.  Questions were answered to the patient's satisfaction.     [name]

## 2019-05-10 NOTE — Transfer of Care (Signed)
Immediate Anesthesia Transfer of Care Note  Patient: Richard Gonzalez  Procedure(s) Performed: NASAL SEPTOPLASTY WITH BILATERAL TURBINATE REDUCTION (Bilateral Nose)  Patient Location: PACU  Anesthesia Type:General  Level of Consciousness: awake, alert , oriented and drowsy  Airway & Oxygen Therapy: Patient Spontanous Breathing and Patient connected to face mask oxygen  Post-op Assessment: Report given to RN and Post -op Vital signs reviewed and stable  Post vital signs: Reviewed and stable  Last Vitals:  Vitals Value Taken Time  BP 148/97 05/10/19 0905  Temp 36.5 C 05/10/19 0905  Pulse 80 05/10/19 0908  Resp 18 05/10/19 0908  SpO2 100 % 05/10/19 0908  Vitals shown include unvalidated device data.  Last Pain:  Vitals:   05/10/19 0656  TempSrc: Oral  PainSc: 0-No pain         Complications: No apparent anesthesia complications

## 2019-05-10 NOTE — Brief Op Note (Signed)
05/10/2019  8:59 AM  PATIENT:  Richard Gonzalez  39 y.o. male  PRE-OPERATIVE DIAGNOSIS:  DEVIATED SEPTUM AND NASAL TURBINATE HYPERTROPHY  POST-OPERATIVE DIAGNOSIS:  DEVIATED SEPTUM AND NASAL TURBINATE HYPERTROPHY  PROCEDURE:  Procedure(s): NASAL SEPTOPLASTY WITH BILATERAL TURBINATE REDUCTION (Bilateral)  SURGEON:  Surgeon(s) and Role:    Drema Halon, MD - Primary  PHYSICIAN ASSISTANT:   ASSISTANTS: none   ANESTHESIA:   general  EBL:  25 mL   BLOOD ADMINISTERED:none  DRAINS: none   LOCAL MEDICATIONS USED:  XYLOCAINE   SPECIMEN:  No Specimen  DISPOSITION OF SPECIMEN:  N/A  COUNTS:  YES  TOURNIQUET:  * No tourniquets in log *  DICTATION: .Other Dictation: Dictation Number [id number]  PLAN OF CARE: Discharge to home after PACU  PATIENT DISPOSITION:  PACU - hemodynamically stable.   Delay start of Pharmacological VTE agent (>24hrs) due to surgical blood loss or risk of bleeding: yes

## 2019-05-10 NOTE — Progress Notes (Signed)
HPI: Richard Gonzalez is a 39 y.o. male who presents  s/p septoplasty and turbinate reductions this morning.  He is unable to tolerate the nasal packing.  I discussed with him that I would recommend waiting till tomorrow morning to remove the packing but he wants it removed today.  Discussed excessive bleeding when the packing was removed as well as possible septal hematoma.  But he still insists to have the packing removed today..   Past Medical History:  Diagnosis Date  . Cluster headache   . Heart murmur   . Migraine    Past Surgical History:  Procedure Laterality Date  . SHOULDER SURGERY Right    Social History   Socioeconomic History  . Marital status: Married    Spouse name: Not on file  . Number of children: 4  . Years of education: Not on file  . Highest education level: High school graduate  Occupational History  . Not on file  Tobacco Use  . Smoking status: Current Every Day Smoker    Years: 21.00    Types: Cigars    Start date: 2000  . Smokeless tobacco: Never Used  . Tobacco comment: up to 3 cigars per day, he has been trying to cut back  Substance and Sexual Activity  . Alcohol use: Yes    Comment: social  . Drug use: Yes    Frequency: 3.0 times per week    Types: Marijuana  . Sexual activity: Not on file  Other Topics Concern  . Not on file  Social History Narrative   Lives at home with wife & children   Right handed   Caffeine: daily, coffee and red bull. Maybe 2 (sometimes 3) red bulls per day and 2 cups of coffee per day   Social Determinants of Health   Financial Resource Strain:   . Difficulty of Paying Living Expenses:   Food Insecurity:   . Worried About Programme researcher, broadcasting/film/video in the Last Year:   . Barista in the Last Year:   Transportation Needs:   . Freight forwarder (Medical):   Marland Kitchen Lack of Transportation (Non-Medical):   Physical Activity:   . Days of Exercise per Week:   . Minutes of Exercise per Session:   Stress:   . Feeling  of Stress :   Social Connections:   . Frequency of Communication with Friends and Family:   . Frequency of Social Gatherings with Friends and Family:   . Attends Religious Services:   . Active Member of Clubs or Organizations:   . Attends Banker Meetings:   Marland Kitchen Marital Status:    Family History  Problem Relation Age of Onset  . Hypertension Mother   . Heart failure Mother   . Headache Mother        bad   . Migraines Mother        bad   . Heart failure Maternal Grandmother   . Hypertension Maternal Grandmother    No Known Allergies Prior to Admission medications   Medication Sig Start Date End Date Taking? Authorizing Provider  acetaminophen (TYLENOL) 325 MG tablet Take 2 capsules by mouth every 4 (four) hours as needed.    [provider]  cephALEXin (KEFLEX) 500 MG capsule Take 1 capsule (500 mg total) by mouth 2 (two) times daily. 05/10/19   Drema Halon, MD  HYDROcodone-acetaminophen (NORCO/VICODIN) 5-325 MG tablet Take 1-2 tablets by mouth every 6 (six) hours as needed for moderate  pain. 05/10/19   Rozetta Nunnery, MD  SUMAtriptan 10 MG/ACT SOLN Place into the nose.    [provider]     Physical Exam: Nasal packing was removed in the office this afternoon.  He actually only had minimal bleeding.  Nasal passages were clear bilaterally.   Assessment: S/p septoplasty and turbinate reductions  Plan: Nasal packing was removed in the office today despite my recommendations not to. He will follow-up in the office in 1 week for recheck.  Reviewed with him concerning using saline nasal irrigation and gave him a sample bottle in the office today and recommended starting this tomorrow.   Radene Journey, MD

## 2019-05-10 NOTE — Op Note (Signed)
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  OPERATION PERFORMED:  Septoplasty with bilateral inferior turbinate reductions using Medtronic turbinate blade.  SURGEON:  [name], MD  ANESTHESIA:  General endotracheal.  COMPLICATIONS:  None.  BRIEF CLINICAL NOTE:  Richard Gonzalez is a 39 year old gentleman who has always had nasal obstruction, much worse on the left side.  He has used nasal steroid sprays without adequate relief.  He elected to undergo surgery consisting of septoplasty and  turbinate reductions to help improve his nasal obstruction.  On exam, he has mild to moderate septal deviation to the left with a spur on the left side.  DESCRIPTION OF PROCEDURE:  After adequate endotracheal anesthesia, the patient received 2 grams Ancef IV preoperatively.  Nose was prepped with Betadine solution and draped in sterile towels.  Nose was then examined.  He had a septal deviation slightly  to the left with some of the cartilaginous septum bowing off of the maxillary crest to the left side anteriorly and more posteriorly had a septal spur on the left side.  Nose was then further prepped with cotton pledgets soaked in Afrin and septum and  turbinates were injected with Xylocaine with epinephrine.  It was elected to perform the hemitransfixion incision along the left caudal edge of the septum.  Mucoperichondrial and mucoperiosteal flaps were elevated posteriorly.  The portion of the  cartilaginous septum that bowed off into the left airway was removed off the maxillary crest on the left side.  Then more posteriorly, a vertical  incision was made through the cartilaginous septum just anterior to the bony septum and mucoperichondrial  and mucoperiosteal flaps were elevated posterior to this.  Superiorly some of the bony septum that bowed to the left side was removed and then the large posterior bony septal spur was removed posteriorly.  This completed the septoplasty portion of  procedure.  Next, inferior turbinate reductions were performed using the Medtronic turbinate blade.  The turbinates were then outfractured and hemostasis was obtained with the cautery.  Next, the hemitransfixion incision was closed with interrupted 3-0 chromic  sutures x2 and the septum was basted with a 3-0 chromic suture.  The nose was packed with a Telfa soaked in mupirocin ointment, which was secured anteriorly with a 2-0 silk suture.  This completed the procedure.  The patient was awoken from anesthesia  and transferred to recovery room and postoperatively doing well.  DISPOSITION:  The patient is discharged home later this morning.  He will follow up in my office tomorrow to have his nasal packs removed.  He was given Keflex 500 mg b.i.d. for 1 week.  CN/NUANCE  D:05/10/2019 T:05/10/2019 JOB:[id number]

## 2019-05-11 ENCOUNTER — Encounter (INDEPENDENT_AMBULATORY_CARE_PROVIDER_SITE_OTHER): Payer: 59 | Admitting: Otolaryngology

## 2019-05-11 ENCOUNTER — Encounter: Payer: Self-pay | Admitting: *Deleted

## 2019-05-18 ENCOUNTER — Ambulatory Visit (INDEPENDENT_AMBULATORY_CARE_PROVIDER_SITE_OTHER): Payer: 59 | Admitting: Otolaryngology

## 2019-05-18 ENCOUNTER — Other Ambulatory Visit: Payer: Self-pay

## 2019-05-18 VITALS — Temp 97.3°F

## 2019-05-18 DIAGNOSIS — Z4889 Encounter for other specified surgical aftercare: Secondary | ICD-10-CM

## 2019-05-18 NOTE — Progress Notes (Signed)
HPI: Richard Gonzalez is a 39 y.o. male who presents 8 days s/p septoplasty and turbinate reductions.  He complains of some discomfort of the upper lip otherwise doing well..   Past Medical History:  Diagnosis Date  . Cluster headache   . Heart murmur   . Migraine    Past Surgical History:  Procedure Laterality Date  . NASAL SEPTOPLASTY W/ TURBINOPLASTY Bilateral 05/10/2019   Procedure: NASAL SEPTOPLASTY WITH BILATERAL TURBINATE REDUCTION;  Surgeon: Drema Halon, MD;  Location: [facility];  Service: ENT;  Laterality: Bilateral;  . SHOULDER SURGERY Right    Social History   Socioeconomic History  . Marital status: Married    Spouse name: Not on file  . Number of children: 4  . Years of education: Not on file  . Highest education level: High school graduate  Occupational History  . Not on file  Tobacco Use  . Smoking status: Current Every Day Smoker    Years: 21.00    Types: Cigars    Start date: 2000  . Smokeless tobacco: Never Used  . Tobacco comment: up to 3 cigars per day, he has been trying to cut back  Substance and Sexual Activity  . Alcohol use: Yes    Comment: social  . Drug use: Yes    Frequency: 3.0 times per week    Types: Marijuana  . Sexual activity: Not on file  Other Topics Concern  . Not on file  Social History Narrative   Lives at home with wife & children   Right handed   Caffeine: daily, coffee and red bull. Maybe 2 (sometimes 3) red bulls per day and 2 cups of coffee per day   Social Determinants of Health   Financial Resource Strain:   . Difficulty of Paying Living Expenses:   Food Insecurity:   . Worried About Programme researcher, broadcasting/film/video in the Last Year:   . Barista in the Last Year:   Transportation Needs:   . Freight forwarder (Medical):   Marland Kitchen Lack of Transportation (Non-Medical):   Physical Activity:   . Days of Exercise per Week:   . Minutes of Exercise per Session:   Stress:   . Feeling of Stress :   Social  Connections:   . Frequency of Communication with Friends and Family:   . Frequency of Social Gatherings with Friends and Family:   . Attends Religious Services:   . Active Member of Clubs or Organizations:   . Attends Banker Meetings:   Marland Kitchen Marital Status:    Family History  Problem Relation Age of Onset  . Hypertension Mother   . Heart failure Mother   . Headache Mother        bad   . Migraines Mother        bad   . Heart failure Maternal Grandmother   . Hypertension Maternal Grandmother    No Known Allergies Prior to Admission medications   Medication Sig Start Date End Date Taking? Authorizing Provider  acetaminophen (TYLENOL) 325 MG tablet Take 2 capsules by mouth every 4 (four) hours as needed.    [provider]  cephALEXin (KEFLEX) 500 MG capsule Take 1 capsule (500 mg total) by mouth 2 (two) times daily. 05/10/19   Drema Halon, MD  HYDROcodone-acetaminophen (NORCO/VICODIN) 5-325 MG tablet Take 1-2 tablets by mouth every 6 (six) hours as needed for moderate pain. 05/10/19   Drema Halon, MD  SUMAtriptan 10  MG/ACT SOLN Place into the nose.    [provider]     Physical Exam: Has moderate crusting in the nasal cavity it was cleaned in the office.  Still has moderate edema of the nasal mucosa.  No signs of infection.   Assessment: S/p septoplasty and turbinate reductions Chronic rhinitis  Plan: Recommended continue use of the saline irrigation and also prescribed Flonase 2 sprays each nostril at night. He will follow-up in 2 weeks for recheck.   Radene Journey, MD

## 2019-05-20 ENCOUNTER — Ambulatory Visit (INDEPENDENT_AMBULATORY_CARE_PROVIDER_SITE_OTHER): Payer: 59 | Admitting: Otolaryngology

## 2019-05-20 ENCOUNTER — Other Ambulatory Visit: Payer: Self-pay

## 2019-05-20 VITALS — Temp 98.2°F

## 2019-05-20 DIAGNOSIS — Z4889 Encounter for other specified surgical aftercare: Secondary | ICD-10-CM

## 2019-05-20 NOTE — Progress Notes (Signed)
HPI: Richard Gonzalez is a 39 y.o. male who presents 9 days s/p septoplasty and turbinate reductions..  Patient was concerned because he saw something in his nose on the right side he wanted checked.  Past Medical History:  Diagnosis Date  . Cluster headache   . Heart murmur   . Migraine    Past Surgical History:  Procedure Laterality Date  . NASAL SEPTOPLASTY W/ TURBINOPLASTY Bilateral 05/10/2019   Procedure: NASAL SEPTOPLASTY WITH BILATERAL TURBINATE REDUCTION;  Surgeon: Rozetta Nunnery, MD;  Location: [place];  Service: ENT;  Laterality: Bilateral;  . SHOULDER SURGERY Right    Social History   Socioeconomic History  . Marital status: Married    Spouse name: Not on file  . Number of children: 4  . Years of education: Not on file  . Highest education level: High school graduate  Occupational History  . Not on file  Tobacco Use  . Smoking status: Current Every Day Smoker    Years: 21.00    Types: Cigars    Start date: 2000  . Smokeless tobacco: Never Used  . Tobacco comment: up to 3 cigars per day, he has been trying to cut back  Substance and Sexual Activity  . Alcohol use: Yes    Comment: social  . Drug use: Yes    Frequency: 3.0 times per week    Types: Marijuana  . Sexual activity: Not on file  Other Topics Concern  . Not on file  Social History Narrative   Lives at home with wife & children   Right handed   Caffeine: daily, coffee and red bull. Maybe 2 (sometimes 3) red bulls per day and 2 cups of coffee per day   Social Determinants of Health   Financial Resource Strain:   . Difficulty of Paying Living Expenses:   Food Insecurity:   . Worried About Charity fundraiser in the Last Year:   . Arboriculturist in the Last Year:   Transportation Needs:   . Film/video editor (Medical):   Marland Kitchen Lack of Transportation (Non-Medical):   Physical Activity:   . Days of Exercise per Week:   . Minutes of Exercise per Session:   Stress:   .  Feeling of Stress :   Social Connections:   . Frequency of Communication with Friends and Family:   . Frequency of Social Gatherings with Friends and Family:   . Attends Religious Services:   . Active Member of Clubs or Organizations:   . Attends Archivist Meetings:   Marland Kitchen Marital Status:    Family History  Problem Relation Age of Onset  . Hypertension Mother   . Heart failure Mother   . Headache Mother        bad   . Migraines Mother        bad   . Heart failure Maternal Grandmother   . Hypertension Maternal Grandmother    No Known Allergies Prior to Admission medications   Medication Sig Start Date End Date Taking? Authorizing Provider  acetaminophen (TYLENOL) 325 MG tablet Take 2 capsules by mouth every 4 (four) hours as needed.   Yes [provider]  cephALEXin (KEFLEX) 500 MG capsule Take 1 capsule (500 mg total) by mouth 2 (two) times daily. 05/10/19  Yes Rozetta Nunnery, MD  HYDROcodone-acetaminophen (NORCO/VICODIN) 5-325 MG tablet Take 1-2 tablets by mouth every 6 (six) hours as needed for moderate pain. 05/10/19  Yes Rozetta Nunnery,  MD  SUMAtriptan 10 MG/ACT SOLN Place into the nose.   Yes [provider]     Physical Exam: Patient has some sutures in the right nostril that were removed in the office today.  Nasal passages otherwise clear with minimal crusting that was cleaned.   Assessment: S/p septoplasty and turbinate reductions  Plan: Recommended regular use of saline rinses during the daytime and use of nasal steroid spray at night.  He will follow-up in 2 weeks for recheck and cleaning.  No signs of infection.   Narda Bonds, MD

## 2019-05-23 ENCOUNTER — Telehealth: Payer: Self-pay | Admitting: Neurology

## 2019-05-23 NOTE — Telephone Encounter (Signed)
I returned pt's wife's call. She is unsure if he has been taking the Verapamil. I let her know per the chart he has not been taking it and it was even removed from his MAR on 05/09/19. She said the prednisone helped but since he has been off the headaches have returned. She will check with the patient regarding the Verapamil and will be in touch with [name]. She verbalized appreciation for the call.

## 2019-05-23 NOTE — Telephone Encounter (Signed)
Pt wife called stating pt has been having the same symptoms with frequent migraines and would like to know if pt could be seen sooner or what options pt has available as he is not feeling any better.

## 2019-05-25 ENCOUNTER — Ambulatory Visit
Admission: RE | Admit: 2019-05-25 | Discharge: 2019-05-25 | Disposition: A | Discharge status: Home / Self Care | Payer: 59 | Source: Ambulatory Visit | Attending: Neurology | Admitting: Neurology

## 2019-05-25 ENCOUNTER — Other Ambulatory Visit: Payer: Self-pay

## 2019-05-25 DIAGNOSIS — R51 Headache with orthostatic component, not elsewhere classified: Secondary | ICD-10-CM | POA: Diagnosis not present

## 2019-05-25 DIAGNOSIS — G44021 Chronic cluster headache, intractable: Secondary | ICD-10-CM

## 2019-05-25 DIAGNOSIS — H5462 Unqualified visual loss, left eye, normal vision right eye: Secondary | ICD-10-CM

## 2019-05-25 DIAGNOSIS — R519 Headache, unspecified: Secondary | ICD-10-CM

## 2019-05-25 MED ORDER — GADOBENATE DIMEGLUMINE 529 MG/ML IV SOLN
18.0000 mL | Freq: Once | INTRAVENOUS | Status: AC | PRN
  Administered 2019-05-25: 18 mL via INTRAVENOUS

## 2019-05-26 ENCOUNTER — Telehealth: Payer: Self-pay | Admitting: *Deleted

## 2019-05-26 NOTE — Telephone Encounter (Signed)
-----   Message from Anson Fret, MD sent at 05/26/2019  8:07 AM EDT ----- MRI brain normal thanks

## 2019-05-26 NOTE — Telephone Encounter (Signed)
Spoke with pt and discussed MRI brain results. He verbalized appreciation and understanding. His questions were answered. Pt also stated he has been taking the Verapamil for about 2 weeks. He increased to 2 tablets. He said he is fine now but just had a 1 hour episode of an excruciating headache. He stated he has noticed the episodes have gotten longer. He will have a short and a long in the same day. Wakes up at night with them. He was advised if the episodes are unlike the usual or if he has any symptoms of stroke (ex. Unilateral weakness, numbness, speech changes, facial droop) to call 911. He is currently not using Tosymra because he had sinus surgery. It didn't seem to help anyway. He stopped drinking Red Bulls and has tried to quit his bad habits. Pt also mentioned that he didn't notice the headaches until after he pushed a bump on the L side of his nose and later found out he had a deviated septum. Pt advised it may take 4-6 weeks to get the full effect of the Verapamil.

## 2019-05-27 ENCOUNTER — Telehealth: Payer: Self-pay | Admitting: Neurology

## 2019-05-27 ENCOUNTER — Other Ambulatory Visit: Payer: Self-pay | Admitting: Neurology

## 2019-05-27 NOTE — Telephone Encounter (Signed)
error 

## 2019-05-27 NOTE — Telephone Encounter (Signed)
I called pt. I discussed this with him. He will let us know if after 4-6 weeks of trying verapamil if his HAs are not improved. He has not started maxalt but will try it. Pt verbalized understanding of recommendations. Pt had no questions at this time but was encouraged to call back if questions arise.

## 2019-05-27 NOTE — Telephone Encounter (Addendum)
It may take longer to get the full effect of verapamil and we may have to further increase it. Did he try the maxalt that he was prescribed?

## 2019-06-02 ENCOUNTER — Encounter (INDEPENDENT_AMBULATORY_CARE_PROVIDER_SITE_OTHER): Payer: 59 | Admitting: Otolaryngology

## 2019-06-16 ENCOUNTER — Telehealth: Payer: Self-pay | Admitting: *Deleted

## 2019-06-16 NOTE — Telephone Encounter (Signed)
Tosymra PA completed on Cover My Meds. Key: [id number]. Awaiting determination.

## 2019-06-21 NOTE — Telephone Encounter (Signed)
Received denial of Tosymra from [employer]. Denied due to not meeting requirements. In past phone note pt had stated he didn't feel this helped him and he couldn't use it at the time d/t sinus surgery. I attempted PA just in case he could try it again the future. He can continue using savings card for this if he uses it again. If we should choose to appeal, fax to [phone] within 180 days.

## 2019-07-22 ENCOUNTER — Ambulatory Visit: Payer: 59 | Admitting: Cardiology

## 2019-08-14 ENCOUNTER — Other Ambulatory Visit: Payer: Self-pay

## 2019-08-14 ENCOUNTER — Emergency Department (HOSPITAL_COMMUNITY): Admission: EM | Admit: 2019-08-14 | Discharge: 2019-08-15 | Discharge status: Against Medical Advice | Payer: 59

## 2019-08-14 NOTE — ED Notes (Signed)
Called pt for triage x1. No answer. 

## 2019-08-15 ENCOUNTER — Ambulatory Visit: Payer: 59 | Admitting: Neurology

## 2019-08-15 NOTE — ED Notes (Signed)
Pt not in lobby.  

## 2019-09-14 ENCOUNTER — Ambulatory Visit: Payer: 59 | Admitting: Neurology

## 2019-09-14 ENCOUNTER — Ambulatory Visit: Payer: 59 | Admitting: Cardiology

## 2019-09-27 NOTE — Progress Notes (Deleted)
[facility]    Provider:  Dr Lucia Gonzalez Requesting Provider: Orbie Gonzalez, Richard Gonzalez Gonzalez Primary Care Provider:  Scifres, Richard Gonzalez Gonzalez  CC:  Migraines  Patient here for follow-up of cluster headaches, at the end of April he had septoplasty and turbinate reductions, due to chronic nasal obstruction, trouble breathing through his nose despite use of nasal steroid sprays, left worse than the right, septal deviation to the left with moderate rhinitis and enlarged inferior turbinates bilaterally, he was taken to the operating room for septoplasty and bilateral inferior turbinate reductions on May 06, 2019.  I reviewed Richard Gonzalez Gonzalez notes as above.  HPI:  Richard Gonzalez Gonzalez is a 39 y.o. male here as requested by Scifres, Richard Cella, PA-C for migraines.  Past medical history  I reviewed Richard Gonzalez Gonzalez' notes, patient with migraine without aura and without status migrainosus not intractable, it appears as though he is tried several preventatives and wanted to be referred to neurology, he is on Maxalt acutely, he complains of 1.5-year history of migraines, typically around the left eye, throbbing, headaches every day and last 30 minutes at a time [name] with associated nausea, also photophobia and phonophobia, he is unable to work when he gets the headaches they are so severe, sumatriptan hand did not work, he cut back on caffeine, headaches sometimes wake him up in the middle of the night, he was started on Topamax in June 2019 but he felt this was not effective, minimal relief by putting pressure on his head, he is a current smoker social alcohol use smokes marijuana.  He is here alone, they started about 1.5 years ago, unknown inciting events, severe, he gets them every day 1-3x a day, it is behind the left eye and in the temple area and in the back of the neck, throbbing and pulsing, rarely on the right, photophobia/phonophobia, solid pain and pressure, he wakes up in the middle of the night often and he  can wake up in the morning with headaches,they start at 10amish or later at 4pm, unknown triggers, he stopped drinking red bulls and he stopped smoking. He is very tired and that's why he drinks red bull, also drinking coffee, he snores. He feels he has less energy. He has tried asa, motrin, tylenol and they don't work at all. Tried flexeril, mobic, naproxen, prednisone taper. The headaches only last 10 minutes - 20 minutes. Stabbing him in the eye, the whole left and his eye waters. Severe. He has not tried the [employer] He has left eye vision changes. No other focal neurologic deficits, associated symptoms, inciting events or modifiable factors.A trigger is alcohol. No other focal neurologic deficits, associated symptoms, inciting events or modifiable factors.Wife is here and also provides much information.   Reviewed notes, labs and imaging from outside physicians, which showed: bmp/cbc normal 2016  Review of Systems: Patient complains of symptoms per HPI as well as the following symptoms: migraine, headache. Pertinent negatives and positives per HPI. All others negative.   Social History   Socioeconomic History  . Marital status: Married    Spouse name: Not on file  . Number of children: 4  . Years of education: Not on file  . Highest education level: High school graduate  Occupational History  . Not on file  Tobacco Use  . Smoking status: Current Every Day Smoker    Years: 21.00    Types: Cigars    Start date: 2000  . Smokeless tobacco: Never Used  . Tobacco comment: up to 3 cigars per  day, he has been trying to cut back  Vaping Use  . Vaping Use: Never used  Substance and Sexual Activity  . Alcohol use: Yes    Comment: social  . Drug use: Yes    Frequency: 3.0 times per week    Types: Marijuana  . Sexual activity: Not on file  Other Topics Concern  . Not on file  Social History Narrative   Lives at home with wife & children   Right handed   Caffeine: daily, coffee and red  bull. Maybe 2 (sometimes 3) red bulls per day and 2 cups of coffee per day   Social Determinants of Health   Financial Resource Strain:   . Difficulty of Paying Living Expenses: Not on file  Food Insecurity:   . Worried About Programme researcher, broadcasting/film/video in the Last Year: Not on file  . Ran Out of Food in the Last Year: Not on file  Transportation Needs:   . Lack of Transportation (Medical): Not on file  . Lack of Transportation (Non-Medical): Not on file  Physical Activity:   . Days of Exercise per Week: Not on file  . Minutes of Exercise per Session: Not on file  Stress:   . Feeling of Stress : Not on file  Social Connections:   . Frequency of Communication with Friends and Family: Not on file  . Frequency of Social Gatherings with Friends and Family: Not on file  . Attends Religious Services: Not on file  . Active Member of Clubs or Organizations: Not on file  . Attends Banker Meetings: Not on file  . Marital Status: Not on file  Intimate Partner Violence:   . Fear of Current or Ex-Partner: Not on file  . Emotionally Abused: Not on file  . Physically Abused: Not on file  . Sexually Abused: Not on file    Family History  Problem Relation Age of Onset  . Hypertension Mother   . Heart failure Mother   . Headache Mother        bad   . Migraines Mother        bad   . Heart failure Maternal Grandmother   . Hypertension Maternal Grandmother     Past Medical History:  Diagnosis Date  . Cluster headache   . Heart murmur   . Migraine     Patient Active Problem List   Diagnosis Date Noted  . Cluster headache   . Mitral regurgitation 07/03/2017  . Palpitations 04/27/2017  . Smoking 04/27/2017  . Dyslipidemia 04/27/2017  . Heart murmur 04/27/2017  . Chest pain 04/23/2017    Past Surgical History:  Procedure Laterality Date  . NASAL SEPTOPLASTY W/ TURBINOPLASTY Bilateral 05/10/2019   Procedure: NASAL SEPTOPLASTY WITH BILATERAL TURBINATE REDUCTION;  Surgeon:  Drema Halon, MD;  Location: [facility];  Service: ENT;  Laterality: Bilateral;  . SHOULDER SURGERY Right     Current Outpatient Medications  Medication Sig Dispense Refill  . acetaminophen (TYLENOL) 325 MG tablet Take 2 capsules by mouth every 4 (four) hours as needed.    . cephALEXin (KEFLEX) 500 MG capsule Take 1 capsule (500 mg total) by mouth 2 (two) times daily. 14 capsule 0  . fluticasone (FLONASE) 50 MCG/ACT nasal spray     . HYDROcodone-acetaminophen (NORCO/VICODIN) 5-325 MG tablet Take 1-2 tablets by mouth every 6 (six) hours as needed for moderate pain. 12 tablet 0  . rizatriptan (MAXALT) 10 MG tablet     .  SUMAtriptan 10 MG/ACT SOLN Place into the nose.     No current facility-administered medications for this visit.    Allergies as of 09/28/2019  . (No Known Allergies)    Vitals: There were no vitals taken for this visit. Last Weight:  Wt Readings from Last 1 Encounters:  05/10/19 192 lb 10.9 oz (87.4 kg)   Last Height:   Ht Readings from Last 1 Encounters:  05/10/19 6' (1.829 m)     Physical exam: Exam: Gen: NAD, conversant, well nourised, well groomed                     CV: RRR. No Carotid Bruits. No peripheral edema, warm, nontender Eyes: Conjunctivae clear without exudates or hemorrhage  Neuro: Detailed Neurologic Exam  Speech:    Speech is normal; fluent and spontaneous with normal comprehension.  Cognition:    The patient is oriented to person, place, and time;     recent and remote memory intact;     language fluent;     normal attention, concentration,     fund of knowledge Cranial Nerves:    The pupils are equal, round, and reactive to light. Attempted fundoscopy could not visualize. Visual fields are full to finger confrontation. Extraocular movements are intact. Trigeminal sensation is intact and the muscles of mastication are normal. The face is symmetric. The palate elevates in the midline. Hearing intact. Voice is  normal. Shoulder shrug is normal. The tongue has normal motion without fasciculations.   Coordination:    No dysmetria Gait:   Normal native gait  Motor Observation:    No asymmetry, no atrophy, and no involuntary movements noted. Tone:    Normal muscle tone.    Posture:    Posture is normal. normal erect    Strength:    Strength is V/V in the upper and lower limbs.      Sensation: intact to LT     Reflex Exam:  DTR's:    Deep tendon reflexes in the upper and lower extremities are normal bilaterally.   Toes:    The toes are equivocal bilaterally.   Clonus:    Clonus is absent.    Assessment/Plan:  39 year old with cluster headaches, he has severe headaches left side last 20 minutes, happens multiple times a day with migrainous features as well as autonomic symptoms(left eye watering), wakes him in the night, likely cluster headaches.  - Will give him a high-dose steroid taper, for cluster headaches we have to use very high dose over 2 weeks - We will give him imitrex nasal spray for [name], injections would be helpful as well or something that works very quickly - Will start Verapamil and titrate up - MRI of the brain to evaluate for cerebral causes, especially in the thalamus and basal ganglia.   No orders of the defined types were placed in this encounter.  No orders of the defined types were placed in this encounter.   Cc: Scifres, Dorothy, PA-C  Naomie Dean, MD  [facility] [address] [place], [place] [address]  Phone [phone] Fax [phone]

## 2019-09-28 ENCOUNTER — Telehealth: Payer: Self-pay | Admitting: *Deleted

## 2019-09-28 ENCOUNTER — Ambulatory Visit: Payer: 59 | Admitting: Neurology

## 2019-09-28 ENCOUNTER — Encounter: Payer: Self-pay | Admitting: Neurology

## 2019-09-28 NOTE — Telephone Encounter (Signed)
Pt no showed follow up appointment today.

## 2019-10-02 IMAGING — CT CT HEART SCORING
2 series · 16 of 20 positions shown, 18 images · non-contrast
Comparison: None.

CLINICAL DATA: Risk stratification

EXAM:
Coronary Calcium Score
TECHNIQUE: The patient was scanned on a Siemens Force scanner. Axial
non-contrast 3 mm slices were carried out through the heart. The
data set was analyzed on a dedicated work station and scored using
the Agatson method.

[Series 2: casc 3.0 i36f 2 bestdiast 70 % · axial · 0.36mm/px · z∈[-282,-162]mm · 8 of 52 slices shown, 10 images]
[im 6/52  vessel]
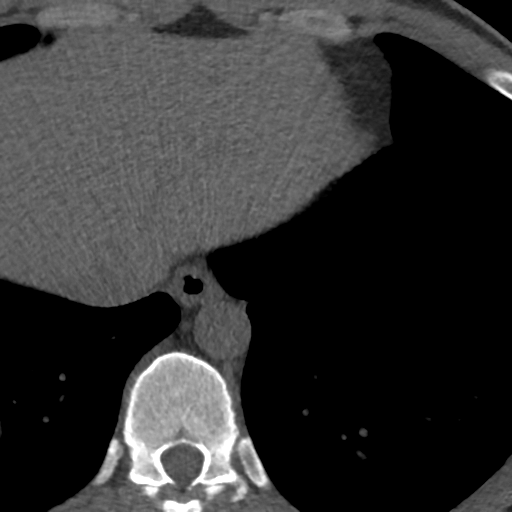
[im 6/52  lung]
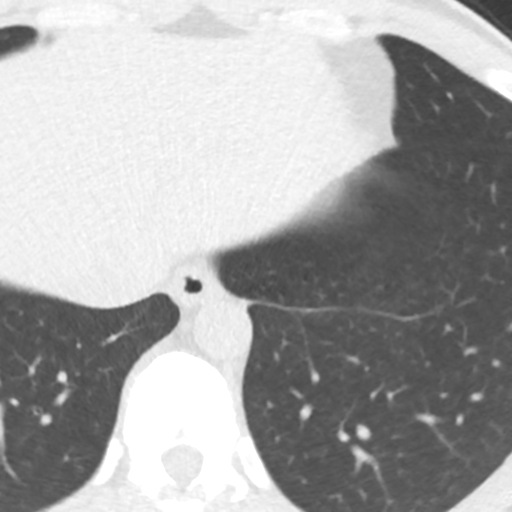
[im 12/52  vessel]
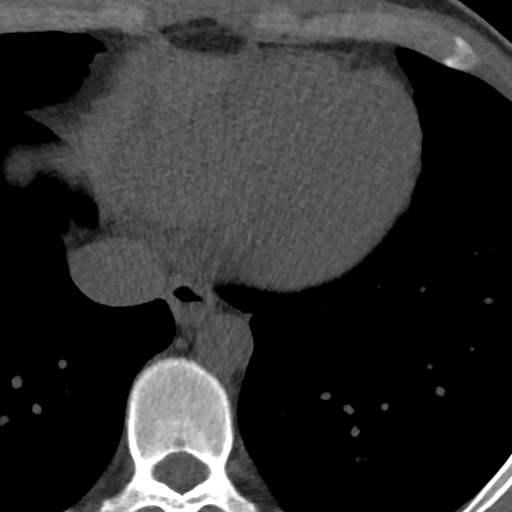
[im 18/52  vessel]
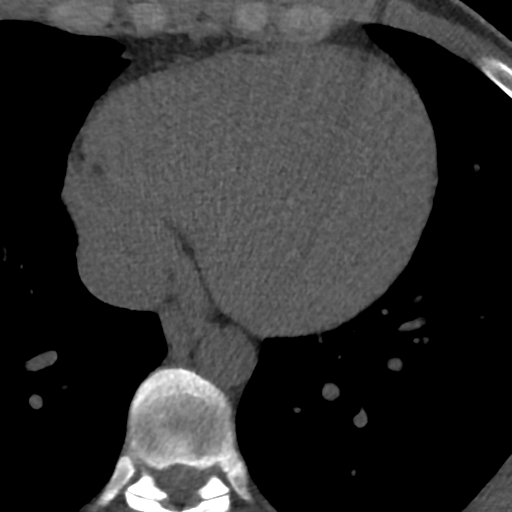
[im 23/52  vessel]
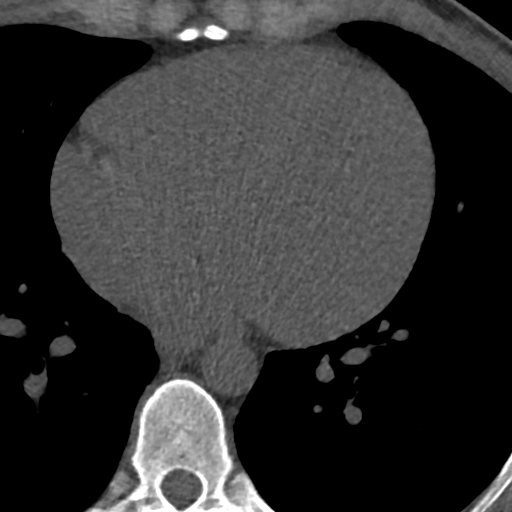
[im 29/52  vessel]
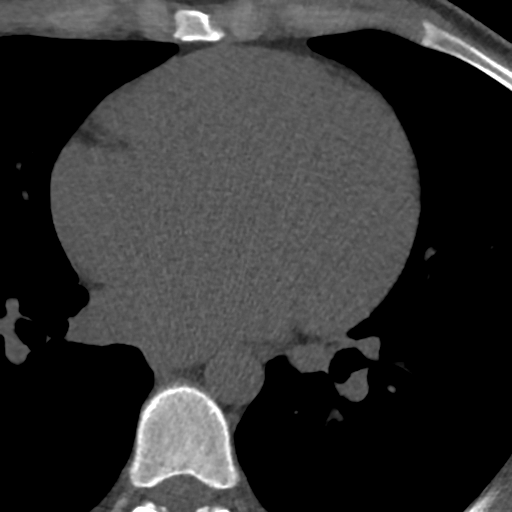
[im 29/52  lung]
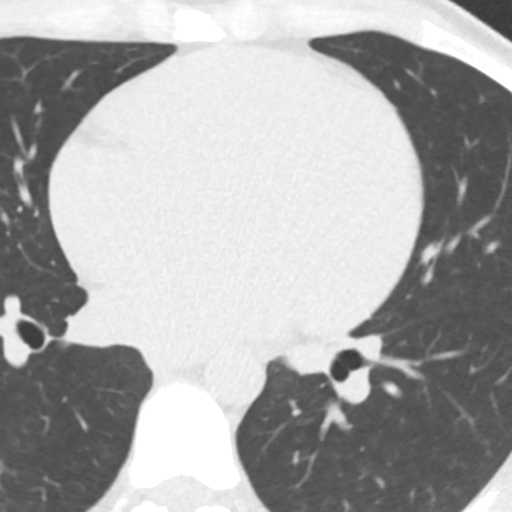
[im 35/52  vessel]
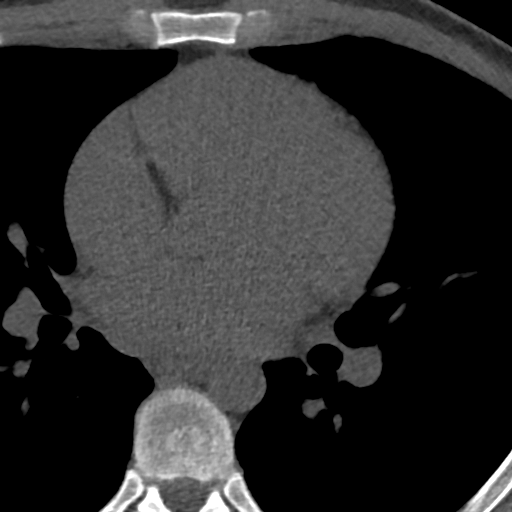
[im 40/52  vessel]
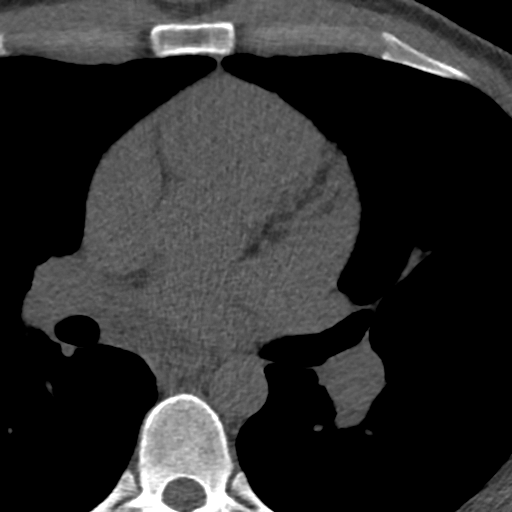
[im 46/52  vessel]
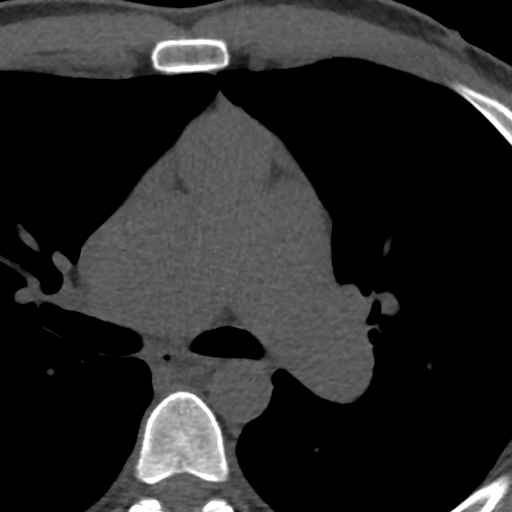

[Series 4: lung st 70 % · axial · 0.68mm/px · z∈[-282,-162]mm · 8 of 52 slices shown]
[im 6/52  lung]
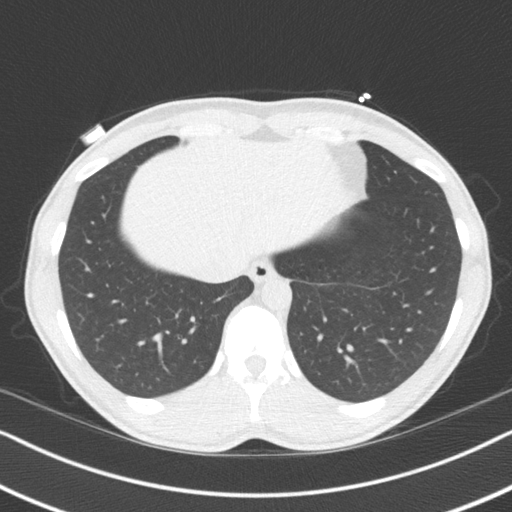
[im 12/52  lung]
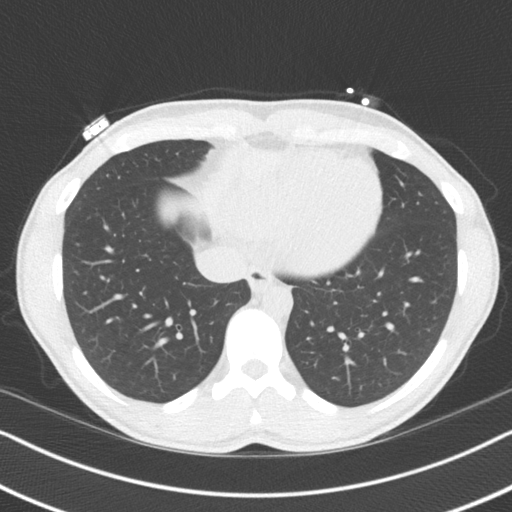
[im 18/52  lung]
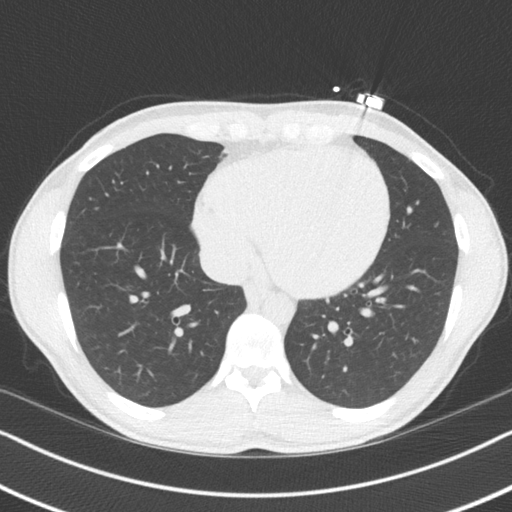
[im 23/52  lung]
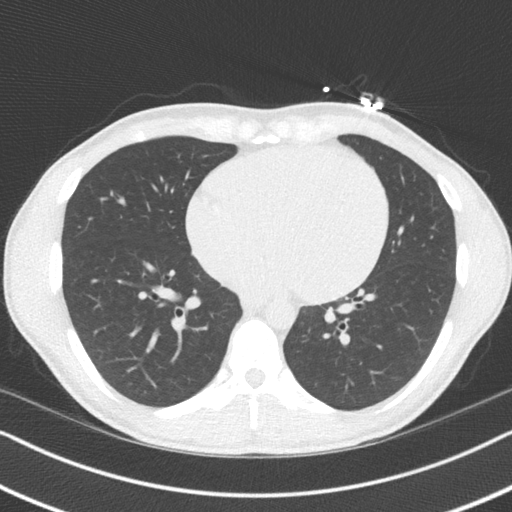
[im 29/52  lung]
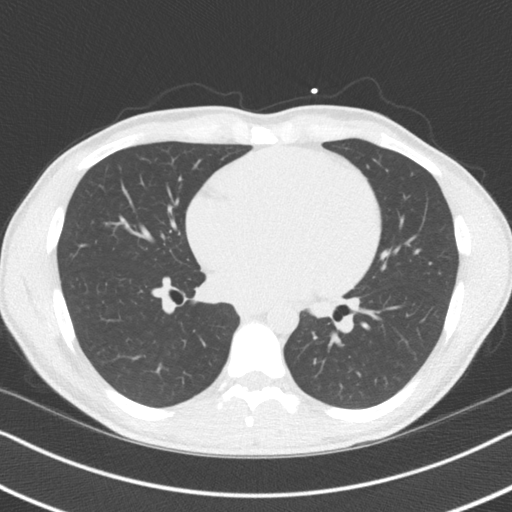
[im 35/52  lung]
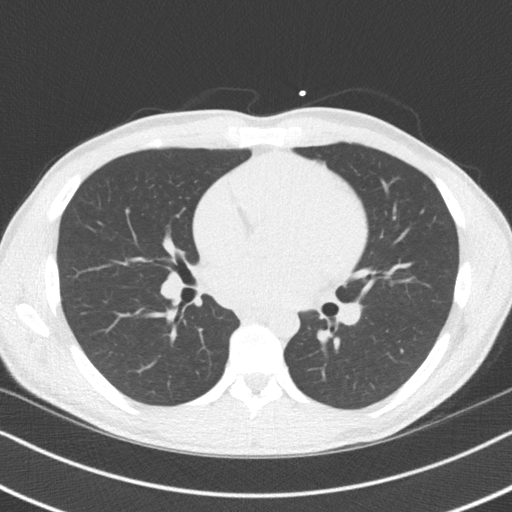
[im 40/52  lung]
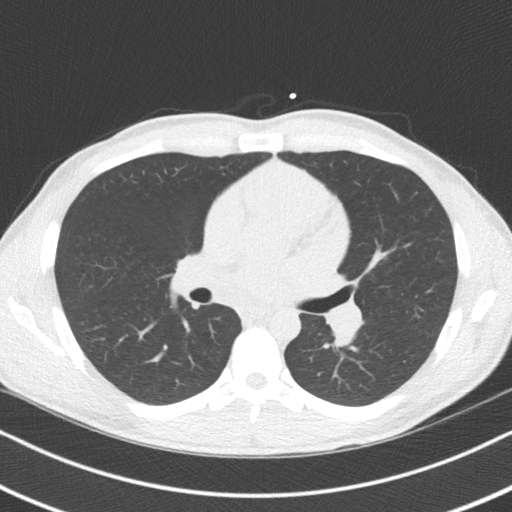
[im 46/52  lung]
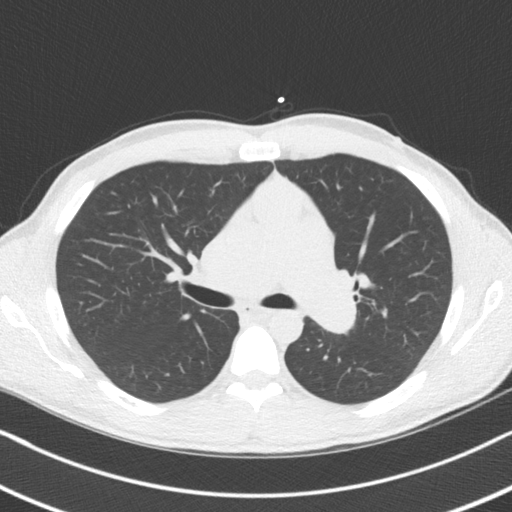

[16 of 20 positions shown; findings below may reference images not displayed]

FINDINGS: Non-cardiac: See separate report from [REDACTED].

Ascending Aorta: Normal size.  No calcifications.

Pericardium: Normal.

Coronary arteries: Normal origin.
IMPRESSION: Coronary calcium score of 0. This was 0 percentile for age and sex
matched control.

EXAM:
OVER-READ INTERPRETATION  CT CHEST

The following report is an over-read performed by radiologist Dr.
Aravind Alcaide [REDACTED] on 06/26/2017. This over-read
does not include interpretation of cardiac or coronary anatomy or
pathology. The coronary calcium score interpretation by the
cardiologist is attached.
FINDINGS: Vascular: Heart is normal size.  Visualized aorta is normal caliber.

Mediastinum/Nodes: No adenopathy in the lower mediastinum or hila.

Lungs/Pleura: Visualized lungs clear.  No effusions.

Upper Abdomen: Imaging into the upper abdomen shows no acute
findings.

Musculoskeletal: Chest wall soft tissues are unremarkable. No acute
bony abnormality.
IMPRESSION: No acute or significant extracardiac abnormality.

## 2019-10-23 ENCOUNTER — Emergency Department (HOSPITAL_COMMUNITY)
Admission: EM | Admit: 2019-10-23 | Discharge: 2019-10-23 | Disposition: A | Discharge status: Home / Self Care | Payer: 59 | Attending: Emergency Medicine | Admitting: Emergency Medicine

## 2019-10-23 ENCOUNTER — Emergency Department (HOSPITAL_COMMUNITY): Payer: 59

## 2019-10-23 ENCOUNTER — Other Ambulatory Visit: Payer: Self-pay

## 2019-10-23 DIAGNOSIS — T1490XA Injury, unspecified, initial encounter: Secondary | ICD-10-CM

## 2019-10-23 DIAGNOSIS — S2020XA Contusion of thorax, unspecified, initial encounter: Secondary | ICD-10-CM

## 2019-10-23 DIAGNOSIS — S60512A Abrasion of left hand, initial encounter: Secondary | ICD-10-CM

## 2019-10-23 DIAGNOSIS — S161XXA Strain of muscle, fascia and tendon at neck level, initial encounter: Secondary | ICD-10-CM

## 2019-10-23 DIAGNOSIS — R519 Headache, unspecified: Secondary | ICD-10-CM | POA: Diagnosis not present

## 2019-10-23 DIAGNOSIS — S299XXA Unspecified injury of thorax, initial encounter: Secondary | ICD-10-CM | POA: Diagnosis present

## 2019-10-23 DIAGNOSIS — R1032 Left lower quadrant pain: Secondary | ICD-10-CM | POA: Diagnosis not present

## 2019-10-23 LAB — COMPREHENSIVE METABOLIC PANEL
ALT: 16 U/L (ref 0–44)
AST: 26 U/L (ref 15–41)
Albumin: 4.5 g/dL (ref 3.5–5.0)
Alkaline Phosphatase: 53 U/L (ref 38–126)
Anion gap: 10 (ref 5–15)
BUN: 9 mg/dL (ref 6–20)
CO2: 25 mmol/L (ref 22–32)
Calcium: 9.4 mg/dL (ref 8.9–10.3)
Chloride: 104 mmol/L (ref 98–111)
Creatinine, Ser: 1.07 mg/dL (ref 0.61–1.24)
GFR, Estimated: >60 mL/min (ref >60)
Glucose, Bld: 98 mg/dL (ref 70–99)
Potassium: 3.8 mmol/L (ref 3.5–5.1)
Sodium: 139 mmol/L (ref 135–145)
Total Bilirubin: 0.6 mg/dL (ref 0.3–1.2)
Total Protein: 7.3 g/dL (ref 6.5–8.1)

## 2019-10-23 LAB — LACTIC ACID, PLASMA: Lactic Acid, Venous: 1.1 mmol/L (ref 0.5–1.9)

## 2019-10-23 LAB — PROTIME-INR
INR: 1 (ref 0.8–1.2)
Prothrombin Time: 13 seconds (ref 11.4–15.2)

## 2019-10-23 LAB — URINALYSIS, ROUTINE W REFLEX MICROSCOPIC
Bacteria, UA: NONE SEEN
Bilirubin Urine: NEGATIVE
Glucose, UA: NEGATIVE mg/dL
Ketones, ur: NEGATIVE mg/dL
Leukocytes,Ua: NEGATIVE
Nitrite: NEGATIVE
Protein, ur: NEGATIVE mg/dL
Specific Gravity, Urine: 1.008 (ref 1.005–1.030)
pH: 6 (ref 5.0–8.0)

## 2019-10-23 LAB — CBC
HCT: 42 % (ref 39.0–52.0)
Hemoglobin: 13.5 g/dL (ref 13.0–17.0)
MCH: 29.3 pg (ref 26.0–34.0)
MCHC: 32.1 g/dL (ref 30.0–36.0)
MCV: 91.3 fL (ref 80.0–100.0)
Platelets: 213 10*3/uL (ref 150–400)
RBC: 4.6 MIL/uL (ref 4.22–5.81)
RDW: 12.8 % (ref 11.5–15.5)
WBC: 12.4 10*3/uL — ABNORMAL HIGH (ref 4.0–10.5)
nRBC: 0 % (ref 0.0–0.2)

## 2019-10-23 LAB — I-STAT CHEM 8, ED
BUN: 11 mg/dL (ref 6–20)
Calcium, Ion: 0.99 mmol/L — ABNORMAL LOW (ref 1.15–1.40)
Chloride: 110 mmol/L (ref 98–111)
Creatinine, Ser: 0.9 mg/dL (ref 0.61–1.24)
Glucose, Bld: 77 mg/dL (ref 70–99)
HCT: 36 % — ABNORMAL LOW (ref 39.0–52.0)
Hemoglobin: 12.2 g/dL — ABNORMAL LOW (ref 13.0–17.0)
Potassium: 4 mmol/L (ref 3.5–5.1)
Sodium: 141 mmol/L (ref 135–145)
TCO2: 25 mmol/L (ref 22–32)

## 2019-10-23 LAB — SAMPLE TO BLOOD BANK

## 2019-10-23 LAB — ETHANOL: Alcohol, Ethyl (B): <10 mg/dL (ref <10)

## 2019-10-23 MED ORDER — IOHEXOL 300 MG/ML  SOLN
100.0000 mL | Freq: Once | INTRAMUSCULAR | Status: AC | PRN
  Administered 2019-10-23: 100 mL via INTRAVENOUS

## 2019-10-23 MED ORDER — ONDANSETRON HCL 4 MG/2ML IJ SOLN
4.0000 mg | Freq: Once | INTRAMUSCULAR | Status: AC
  Administered 2019-10-23: 4 mg via INTRAVENOUS
  Filled 2019-10-23: qty 2

## 2019-10-23 MED ORDER — MORPHINE SULFATE (PF) 4 MG/ML IV SOLN
4.0000 mg | Freq: Once | INTRAVENOUS | Status: AC
  Administered 2019-10-23: 4 mg via INTRAVENOUS
  Filled 2019-10-23: qty 1

## 2019-10-23 MED ORDER — HYDROCODONE-ACETAMINOPHEN 5-325 MG PO TABS
1.0000 | ORAL_TABLET | Freq: Four times a day (QID) | ORAL | 0 refills | Status: DC | PRN
Start: 2019-10-23 — End: 2019-10-23

## 2019-10-23 MED ORDER — HYDROCODONE-ACETAMINOPHEN 5-325 MG PO TABS
1.0000 | ORAL_TABLET | Freq: Four times a day (QID) | ORAL | 0 refills | Status: DC | PRN
Start: 2019-10-23 — End: 2019-12-02

## 2019-10-23 MED ORDER — SODIUM CHLORIDE 0.9 % IV BOLUS
1000.0000 mL | Freq: Once | INTRAVENOUS | Status: AC
  Administered 2019-10-23: 1000 mL via INTRAVENOUS

## 2019-10-23 NOTE — Progress Notes (Signed)
Orthopedic Tech Progress Note Patient Details:  Clarke Peretz September 30, 1980 [id number] Level 2 Trauma Patient ID: Foye Spurling, male   DOB: Aug 04, 1980, 39 y.o.   MRN: [id number]    Gerald Stabs 10/23/2019, 4:55 PM

## 2019-10-23 NOTE — Progress Notes (Signed)
   10/23/19 1711  Clinical Encounter Type  Visited With Family;Health care provider  Visit Type Initial;ED;Trauma   Chaplain responded to a trauma in the ED. Chaplain met with patient's wife, and extended hospitality. Chaplain introduced spiritual care services. Spiritual care services available as needed.   Jeri Lager, Chaplain
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
## 2019-10-23 NOTE — ED Notes (Signed)
Patient transported to CT 
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
## 2019-10-23 NOTE — ED Notes (Addendum)
Pt able to ambulate to bathroom with unsteady gait.  Pt mostly limping from the left side due to pain.

## 2019-11-02 ENCOUNTER — Other Ambulatory Visit: Payer: Self-pay | Admitting: Physician Assistant

## 2019-11-02 DIAGNOSIS — R1031 Right lower quadrant pain: Secondary | ICD-10-CM

## 2019-11-03 ENCOUNTER — Ambulatory Visit
Admission: RE | Admit: 2019-11-03 | Discharge: 2019-11-03 | Disposition: A | Discharge status: Home / Self Care | Payer: 59 | Source: Ambulatory Visit | Attending: Physician Assistant | Admitting: Physician Assistant

## 2019-11-03 DIAGNOSIS — R1031 Right lower quadrant pain: Secondary | ICD-10-CM

## 2019-11-03 MED ORDER — IOPAMIDOL (ISOVUE-370) INJECTION 76%
80.0000 mL | Freq: Once | INTRAVENOUS | Status: AC | PRN
  Administered 2019-11-03: 80 mL via INTRAVENOUS

## 2019-11-09 ENCOUNTER — Other Ambulatory Visit (HOSPITAL_COMMUNITY): Payer: Self-pay | Admitting: Urology

## 2019-11-09 ENCOUNTER — Other Ambulatory Visit: Payer: Self-pay | Admitting: Urology

## 2019-11-09 DIAGNOSIS — N50812 Left testicular pain: Secondary | ICD-10-CM

## 2019-11-17 ENCOUNTER — Encounter (HOSPITAL_COMMUNITY): Payer: Self-pay

## 2019-11-17 ENCOUNTER — Ambulatory Visit (HOSPITAL_COMMUNITY): Payer: 59 | Attending: Urology

## 2019-12-02 ENCOUNTER — Other Ambulatory Visit: Payer: Self-pay

## 2019-12-02 DIAGNOSIS — G43009 Migraine without aura, not intractable, without status migrainosus: Secondary | ICD-10-CM

## 2019-12-02 DIAGNOSIS — G43909 Migraine, unspecified, not intractable, without status migrainosus: Secondary | ICD-10-CM | POA: Insufficient documentation

## 2019-12-02 DIAGNOSIS — I700 Atherosclerosis of aorta: Secondary | ICD-10-CM

## 2019-12-02 HISTORY — DX: Migraine without aura, not intractable, without status migrainosus: G43.009

## 2019-12-02 HISTORY — DX: Atherosclerosis of aorta: I70.0

## 2019-12-05 ENCOUNTER — Ambulatory Visit (INDEPENDENT_AMBULATORY_CARE_PROVIDER_SITE_OTHER): Payer: 59

## 2019-12-05 ENCOUNTER — Encounter: Payer: Self-pay | Admitting: Cardiology

## 2019-12-05 ENCOUNTER — Ambulatory Visit (INDEPENDENT_AMBULATORY_CARE_PROVIDER_SITE_OTHER): Payer: 59 | Admitting: Cardiology

## 2019-12-05 ENCOUNTER — Other Ambulatory Visit: Payer: Self-pay

## 2019-12-05 VITALS — BP 128/80 | HR 84 | Ht 72.0 in | Wt 192.0 lb

## 2019-12-05 DIAGNOSIS — F17200 Nicotine dependence, unspecified, uncomplicated: Secondary | ICD-10-CM

## 2019-12-05 DIAGNOSIS — R002 Palpitations: Secondary | ICD-10-CM

## 2019-12-05 DIAGNOSIS — E785 Hyperlipidemia, unspecified: Secondary | ICD-10-CM

## 2019-12-05 DIAGNOSIS — I340 Nonrheumatic mitral (valve) insufficiency: Secondary | ICD-10-CM | POA: Diagnosis not present

## 2019-12-05 NOTE — Addendum Note (Signed)
Addended by: Hazle Quant on: 12/05/2019 03:59 PM   Modules accepted: Orders
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
## 2019-12-05 NOTE — Patient Instructions (Signed)
Medication Instructions:  Your physician recommends that you continue on your current medications as directed. Please refer to the Current Medication list given to you today.  *If you need a refill on your cardiac medications before your next appointment, please call your pharmacy*   Lab Work: Your physician recommends that you return for lab work today: lipid   If you have labs (blood work) drawn today and your tests are completely normal, you will receive your results only by: Marland Kitchen MyChart Message (if you have MyChart) OR . A paper copy in the mail If you have any lab test that is abnormal or we need to change your treatment, we will call you to review the results.   Testing/Procedures: Your physician has requested that you have an echocardiogram. Echocardiography is a painless test that uses sound waves to create images of your heart. It provides your doctor with information about the size and shape of your heart and how well your heart's chambers and valves are working. This procedure takes approximately one hour. There are no restrictions for this procedure.  A zio monitor was ordered today. It will remain on for 7 days. You will then return monitor and event diary in provided box. It takes 1-2 weeks for report to be downloaded and returned to [name]. We will call you with the results. If monitor falls off or has orange flashing light, please call Zio for further instructions.      Follow-Up: At [facility], you and your health needs are our priority.  As part of our continuing mission to provide you with exceptional heart care, we have created designated Provider Care Teams.  These Care Teams include your primary Cardiologist (physician) and Advanced Practice Providers (APPs -  Physician Assistants and Nurse Practitioners) who all work together to provide you with the care you need, when you need it.  We recommend signing up for the patient portal called "MyChart".  Sign up information is  provided on this After Visit Summary.  MyChart is used to connect with patients for Virtual Visits (Telemedicine).  Patients are able to view lab/test results, encounter notes, upcoming appointments, etc.  Non-urgent messages can be sent to your provider as well.   To learn more about what you can do with MyChart, go to [email or web address].    Your next appointment:   6 month(s)  The format for your next appointment:   In Person  Provider:   Gypsy Balsam, MD   Other Instructions   Echocardiogram An echocardiogram is a procedure that uses painless sound waves (ultrasound) to produce an image of the heart. Images from an echocardiogram can provide important information about:  Signs of coronary artery disease (CAD).  Aneurysm detection. An aneurysm is a weak or damaged part of an artery wall that bulges out from the normal force of blood pumping through the body.  Heart size and shape. Changes in the size or shape of the heart can be associated with certain conditions, including heart failure, aneurysm, and CAD.  Heart muscle function.  Heart valve function.  Signs of a past heart attack.  Fluid buildup around the heart.  Thickening of the heart muscle.  A tumor or infectious growth around the heart valves. Tell a health care provider about:  Any allergies you have.  All medicines you are taking, including vitamins, herbs, eye drops, creams, and over-the-counter medicines.  Any blood disorders you have.  Any surgeries you have had.  Any medical conditions you have.  Whether  you are pregnant or may be pregnant. What are the risks? Generally, this is a safe procedure. However, problems may occur, including:  Allergic reaction to dye (contrast) that may be used during the procedure. What happens before the procedure? No specific preparation is needed. You may eat and drink normally. What happens during the procedure?   An IV tube may be inserted into one  of your veins.  You may receive contrast through this tube. A contrast is an injection that improves the quality of the pictures from your heart.  A gel will be applied to your chest.  A wand-like tool (transducer) will be moved over your chest. The gel will help to transmit the sound waves from the transducer.  The sound waves will harmlessly bounce off of your heart to allow the heart images to be captured in real-time motion. The images will be recorded on a computer. The procedure may vary among health care providers and hospitals. What happens after the procedure?  You may return to your normal, everyday life, including diet, activities, and medicines, unless your health care provider tells you not to do that. Summary  An echocardiogram is a procedure that uses painless sound waves (ultrasound) to produce an image of the heart.  Images from an echocardiogram can provide important information about the size and shape of your heart, heart muscle function, heart valve function, and fluid buildup around your heart.  You do not need to do anything to prepare before this procedure. You may eat and drink normally.  After the echocardiogram is completed, you may return to your normal, everyday life, unless your health care provider tells you not to do that. This information is not intended to replace advice given to you by your health care provider. Make sure you discuss any questions you have with your health care provider. Document Revised: 04/22/2018 Document Reviewed: 02/02/2016 Elsevier Patient Education  2020 [employer].

## 2019-12-06 LAB — LIPID PANEL
Chol/HDL Ratio: 4.7 ratio (ref 0.0–5.0)
Cholesterol, Total: 270 mg/dL — ABNORMAL HIGH (ref 100–199)
HDL: 58 mg/dL (ref >39)
LDL Chol Calc (NIH): 158 mg/dL — ABNORMAL HIGH (ref 0–99)
Triglycerides: 293 mg/dL — ABNORMAL HIGH (ref 0–149)
VLDL Cholesterol Cal: 54 mg/dL — ABNORMAL HIGH (ref 5–40)

## 2019-12-26 ENCOUNTER — Ambulatory Visit (HOSPITAL_BASED_OUTPATIENT_CLINIC_OR_DEPARTMENT_OTHER): Payer: 59 | Attending: Cardiology

## 2020-01-10 ENCOUNTER — Telehealth: Payer: Self-pay

## 2020-01-10 NOTE — Telephone Encounter (Signed)
Spoke with patient regarding results and recommendation.  Patient verbalizes understanding and is agreeable to plan of care. Advised patient to call back with any issues or concerns.  

## 2020-01-10 NOTE — Telephone Encounter (Signed)
-----   Message from Georgeanna Lea, MD sent at 01/10/2020 12:39 PM EST ----- Normal monitor, triggered event showing sinus rhythm

## 2020-01-30 ENCOUNTER — Ambulatory Visit: Payer: 59 | Admitting: Neurology

## 2020-03-12 ENCOUNTER — Encounter: Payer: Self-pay | Admitting: *Deleted

## 2020-03-13 ENCOUNTER — Encounter: Payer: Self-pay | Admitting: Neurology

## 2020-03-13 ENCOUNTER — Ambulatory Visit (INDEPENDENT_AMBULATORY_CARE_PROVIDER_SITE_OTHER): Payer: 59 | Admitting: Neurology

## 2020-03-13 VITALS — BP 133/81 | HR 92 | Ht 72.0 in | Wt 194.0 lb

## 2020-03-13 DIAGNOSIS — G44019 Episodic cluster headache, not intractable: Secondary | ICD-10-CM

## 2020-03-13 DIAGNOSIS — G44309 Post-traumatic headache, unspecified, not intractable: Secondary | ICD-10-CM

## 2020-03-13 DIAGNOSIS — M5481 Occipital neuralgia: Secondary | ICD-10-CM

## 2020-03-13 HISTORY — DX: Episodic cluster headache, not intractable: G44.019

## 2020-03-13 HISTORY — DX: Post-traumatic headache, unspecified, not intractable: G44.309

## 2020-03-13 HISTORY — DX: Occipital neuralgia: M54.81

## 2020-03-13 MED ORDER — PREGABALIN 50 MG PO CAPS
50.0000 mg | ORAL_CAPSULE | Freq: Two times a day (BID) | ORAL | 6 refills | Status: DC
Start: 2020-03-13 — End: 2021-04-22

## 2020-03-13 NOTE — Patient Instructions (Addendum)
Occipital Neuralgia: Recommend starting Lyrica(which may also help with the cluster headaches). Also recommend "occipital nerve blocks". Cluster headaches: Emgality (new injection), Verapamil(already tried), May also consider Topiramate  Pregabalin capsules What is this medicine? PREGABALIN (pre GAB a lin) is used to treat nerve pain from diabetes, shingles, spinal cord injury, and fibromyalgia. It is also used to control seizures in epilepsy. This medicine may be used for other purposes; ask your health care provider or pharmacist if you have questions. COMMON BRAND NAME(S): Lyrica What should I tell my health care provider before I take this medicine? They need to know if you have any of these conditions:  drug abuse or addiction  heart failure  kidney disease  lung disease  suicidal thoughts, plans or attempt  an unusual or allergic reaction to pregabalin, other medicines, foods, dyes, or preservatives  pregnant or trying to get pregnant  breast-feeding How should I use this medicine? Take this medicine by mouth with water. Take it as directed on the prescription label at the same time every day. You can take it with or without food. If it upsets your stomach, take it with food. Keep taking it unless your health care provider tells you to stop. A special MedGuide will be given to you by the pharmacist with each prescription and refill. Be sure to read this information carefully each time. Talk to your health care provider about the use of this medicine in children. While it may be prescribed for children as young as 1 month for selected conditions, precautions do apply. Overdosage: If you think you have taken too much of this medicine contact a poison control center or emergency room at once. NOTE: This medicine is only for you. Do not share this medicine with others. What if I miss a dose? If you miss a dose, take it as soon as you can. If it is almost time for your next dose,  take only that dose. Do not take double or extra doses. What may interact with this medicine?  alcohol  antihistamines for allergy, cough, and cold  certain medicines for anxiety or sleep  certain medicines for blood pressure, heart disease  certain medicines for depression like amitriptyline, fluoxetine, sertraline  certain medicines for diabetes, like pioglitazone, rosiglitazone  certain medicines for seizures like phenobarbital, primidone  general anesthetics like halothane, isoflurane, methoxyflurane, propofol  medicines that relax muscles for surgery  narcotic medicines for pain  phenothiazines like chlorpromazine, mesoridazine, prochlorperazine, thioridazine This list may not describe all possible interactions. Give your health care provider a list of all the medicines, herbs, non-prescription drugs, or dietary supplements you use. Also tell them if you smoke, drink alcohol, or use illegal drugs. Some items may interact with your medicine. What should I watch for while using this medicine? Visit your health care provider for regular checks on your progress. Tell your health care provider if your symptoms do not start to get better or if they get worse. Do not suddenly stop taking this medicine. You may develop a severe reaction. Your health care provider will tell you how much medicine to take. If your health care provider wants you to stop the medicine, the dose may be slowly lowered over time to avoid any side effects. You may get drowsy or dizzy. Do not drive, use machinery, or do anything that needs mental alertness until you know how this medicine affects you. Do not stand up or sit up quickly, especially if you are an older patient. This reduces  the risk of dizzy or fainting spells. Alcohol may interfere with the effect of this medicine. Avoid alcoholic drinks. If you or your family notice any changes in your behavior, such as new or worsening depression, thoughts of harming  yourself, anxiety, other unusual or disturbing thoughts, or memory loss, call your health care provider right away. Wear a medical ID bracelet or chain if you are taking this medicine for seizures. Carry a card that describes your condition. List the medicines and doses you take on the card. This medicine may make it more difficult to father a child. Talk to your health care provider if you are concerned about your fertility. What side effects may I notice from receiving this medicine? Side effects that you should report to your doctor or health care provider as soon as possible:  allergic reactions (skin rash, itching or hives; swelling of the face, lips, or tongue)  changes in vision  edema (sudden weight gain; swelling of the ankles, feet, hands or other unusual swelling; trouble breathing)  muscle injury (dark urine; trouble passing urine or change in the amount of urine; unusually weak or tired; muscle pain; back pain)  suicidal thoughts, mood changes  trouble breathing  unusual bruising or bleeding Side effects that usually do not require medical attention (report these to your doctor or health care provider if they continue or are bothersome):  dizziness  dry mouth  tiredness  weight gain This list may not describe all possible side effects. Call your doctor for medical advice about side effects. You may report side effects to FDA at 1-800-FDA-1088. Where should I keep my medicine? Keep out of the reach of children and pets. This medicine can be abused. Keep it in a safe place to protect it from theft. Do not share it with anyone. It is only for you. Selling or giving away this medicine is dangerous and against the law. Store at [employer] [address] (77 degrees F). Get rid of any unused medicine after the expiration date. This medicine may cause harm and death if it is taken by other adults, children, or pets. It is important to get rid of the medicine as soon as you no longer need it  or it is expired. You can do this in two ways:  Take the medicine to a medicine take-back program. Check with your pharmacy or law enforcement to find a location.  If you cannot return the medicine, check the label or package insert to see if the medicine should be thrown out in the garbage or flushed down the toilet. If you are not sure, ask your health care provider. If it is safe to put it in the trash, take the medicine out of the container. Mix the medicine with cat litter, dirt, coffee grounds, or other unwanted substance. Seal the mixture in a bag or container. Put it in the trash. NOTE: This sheet is a summary. It may not cover all possible information. If you have questions about this medicine, talk to your doctor, pharmacist, or health care provider.  2021 Elsevier/Gold Standard (2019-04-15 22:46:17)   Occipital Neuralgia  Occipital neuralgia is a type of headache that causes brief episodes of very bad pain in the back of the head. Pain from occipital neuralgia may spread (radiate) to other parts of the head. These headaches may be caused by irritation of the nerves that leave the spinal cord high up in the neck, just below the base of the skull (occipital nerves). The occipital nerves  transmit sensations from the back of the head, the top of the head, and the areas behind the ears. What are the causes? This condition can occur without any known cause (primary headache syndrome). In other cases, this condition is caused by pressure on or irritation of one of the two occipital nerves. Pressure and irritation may be due to:  Muscle spasm in the neck.  Neck injury.  Wear and tear of the vertebrae in the neck (osteoarthritis).  Disease of the disks that separate the vertebrae.  Swollen blood vessels that put pressure on the occipital nerves.  Infections.  Tumors.  Diabetes. What are the signs or symptoms? This condition causes brief burning, stabbing, electric, shocking, or  shooting pain in the back of the head that can radiate to the top of the head. It can happen on one side or both sides of the head. It can also cause:  Pain behind the eye.  Pain triggered by neck movement or hair brushing.  Scalp tenderness.  Aching in the back of the head between episodes of very bad pain.  Pain that gets worse with exposure to bright lights. How is this diagnosed? Your health care provider may diagnose the condition based on a physical exam and your symptoms. Tests may be done, such as:  Imaging studies of the brain and neck (cervical spine), such as an MRI or CT scan. These look for causes of pinched nerves.  Applying pressure to the nerves in the neck to try to re-create the pain.  Injection of numbing medicine into the occipital nerve areas to see if pain goes away (diagnostic nerve block).   How is this treated? Treatment for this condition may begin with simple measures, such as:  Rest.  Massage.  Applying heat or cold to the area.  Over-the-counter pain relievers. If these measures do not work, you may need other treatments, including:  Medicines, such as: ? Prescription-strength anti-inflammatory medicines. ? Muscle relaxants. ? Anti-seizure medicines, which can relieve pain. ? Antidepressants, which can relieve pain. ? Injected medicines, such as medicines that numb the area (local anesthetic) and steroids.  Pulsed radiofrequency ablation. This is when wires are implanted to deliver electrical impulses that block pain signals from the occipital nerve.  Surgery to relieve nerve pressure.  Physical therapy. Follow these instructions at home: Managing pain  Avoid any activities that cause pain.  Rest when you have an attack of pain.  Try gentle massage to relieve pain.  Try a different pillow or sleeping position.  If directed, apply heat to the affected area as often as told by your health care provider. Use the heat source that your  health care provider recommends, such as a moist heat pack or a heating pad. ? Place a towel between your skin and the heat source. ? Leave the heat on for 20-30 minutes. ? Remove the heat if your skin turns bright red. This is especially important if you are unable to feel pain, heat, or cold. You have a greater risk of getting burned.  If directed, put ice on the back of your head and neck area. To do this: ? Put ice in a plastic bag. ? Place a towel between your skin and the bag. ? Leave the ice on for 20 minutes, 2-3 times a day. ? Remove the ice if your skin turns bright red. This is very important. If you cannot feel pain, heat, or cold, you have a greater risk of damage to the area.  General instructions  Take over-the-counter and prescription medicines only as told by your health care provider.  Avoid things that make your symptoms worse, such as bright lights.  Try to stay active. Get regular exercise that does not cause pain. Ask your health care provider to suggest safe exercises for you.  Work with a physical therapist to learn stretching exercises you can do at home.  Practice good posture.  Keep all follow-up visits. This is important. Contact a health care provider if:  Your medicine is not working.  You have new or worsening symptoms. Get help right away if:  You have very bad head pain that does not go away.  You have a sudden change in vision, balance, or speech. These symptoms may represent a serious problem that is an emergency. Do not wait to see if the symptoms will go away. Get medical help right away. Call your local emergency services (911 in the [place] Do not drive yourself to the hospital. Summary  Occipital neuralgia is a type of headache that causes brief episodes of very bad pain in the back of the head.  Pain from occipital neuralgia may spread (radiate) to other parts of the head.  Treatment for this condition includes rest, massage, and  medicines. This information is not intended to replace advice given to you by your health care provider. Make sure you discuss any questions you have with your health care provider. Document Revised: 10/30/2019 Document Reviewed: 10/30/2019 Elsevier Patient Education  2021 [employer]   Occipital Nerve Block Patient Information  Description: The occipital nerves originate in the cervical (neck) spinal cord and travel upward through muscle and tissue to supply sensation to the back of the head and top of the scalp.  In addition, the nerves control some of the muscles of the scalp.  Occipital neuralgia is an irritation of these nerves which can cause headaches, numbness of the scalp, and neck discomfort.     The occipital nerve block will interrupt nerve transmission through these nerves and can relieve pain and spasm.  The block consists of insertion of a small needle under the skin in the back of the head to deposit local anesthetic (numbing medicine) and/or steroids around the nerve.  The entire block usually lasts less than 5 minutes.  Conditions which may be treated by occipital blocks:   Muscular pain and spasm of the scalp  Nerve irritation, back of the head  Headaches  Upper neck pain  Preparation for the injection:  5. Do not eat any solid food or dairy products within 8 hours of your appointment. 6. You may drink clear liquids up to 3 hours before appointment.  Clear liquids include water, black coffee, juice or soda.  No milk or cream please. 7. You may take your regular medication, including pain medications, with a sip of water before you appointment.  Diabetics should hold regular insulin (if taken separately) and take 1/2 normal NPH dose the morning of the procedure.  Carry some sugar containing items with you to your appointment. 8. A driver must accompany you and be prepared to drive you home after your procedure. 9. Bring all your current medications with you. 10. An  IV may be inserted and sedation may be given at the discretion of the physician. 11. A blood pressure cuff, EKG, and other monitors will often be applied during the procedure.  Some patients may need to have extra oxygen administered for a short period. 12. You will be asked  to provide medical information, including your allergies and medications, prior to the procedure.  We must know immediately if you are taking blood thinners (like Coumadin/Warfarin) or if you are allergic to IV iodine contrast (dye).  We must know if you could possible be pregnant.  13. Do not wear a high collared shirt or turtleneck.  Tie long hair up in the back if possible.  Possible side-effects:   Bleeding from needle site  Infection (rare, may require surgery)  Nerve injury (rare)  Hair on back of neck can be tinged with iodine scrub (this will wash out)  Light-headedness (temporary)  Pain at injection site (several days)  Decreased blood pressure (rare, temporary)  Seizure (very rare)  Call if you experience:   Hives or difficulty breathing ( go to the emergency room)  Inflammation or drainage at the injection site(s)  Please note:  Although the local anesthetic injected can often make your painful muscles or headache feel good for several hours after the injection, the pain may return.  It takes 3-7 days for steroids to work.  You may not notice any pain relief for at least one week.  If effective, we will often do a series of injections spaced 3-6 weeks apart to maximally decrease your pain.   Cluster Headache A cluster headache is a type of primary headache that causes deep, intense head pain. Cluster headaches can last from 15 minutes to 3 hours. They usually occur on one side of the head or face. They may occur on the other side of the head when a new cluster of headaches begins. Cluster headaches occur repeatedly over weeks to months. They may happen several times a day. They often occur at  the same time of day, often at night. They may happen more often in the fall and springtime. What are the causes? The cause of this condition is not known. Unlike migraine and tension headache, a cluster headache generally is not associated with triggers, such as foods, hormonal changes, or stress. What increases the risk? The following factors may make you likely to develop this condition:  Being a male between the ages of 62-50 years old.  Smoking or using products that contain nicotine or tobacco.  Having elevated levels of histamine. This can happen in people who have allergies.  Taking medicines that cause blood vessels to expand, such as nitroglycerin.  Having a parent or sibling who has cluster headaches. What are the signs or symptoms? Symptoms of this condition include:  Extremely bad pain on one side of the head that begins behind or around your eye or temple but may radiate to other areas of your face, head, and neck.  Nausea.  Sensitivity to light.  Runny nose and nasal stuffiness.  Forehead or facial sweating on the affected side.  Droopy or swollen eyelid, eye redness, or tearing on the affected side.  Restlessness and agitation.  Pale skin (pallor) or flushing on your face. How is this diagnosed? This condition may be diagnosed based on:  Your description of the attacks, including your pain, the location and severity of your headaches, and symptoms. Your health care provider may also ask how often your headaches occur and how long they last.  A neurological examination to detect physical signs of a neurological disorder. Your health care provider will test your senses, reflexes, and nerves. The exam is usually normal in people who have cluster headaches.  Tests. Your health care provider may order additional tests to see if  your headaches are caused by another medical condition. These tests may show that you do not have cluster headaches. Tests may  include: ? MRI. ? CT scan. ? Blood tests. How is this treated? This condition may be treated with:  Medicines to relieve pain and to prevent repeated attacks. Some people may need a combination of medicines.  Oxygen that is breathed in through a mask. This helps to relieve pain of an attack in 15-20 minutes. Follow these instructions at home: Headache diary Keep a headache diary as told by your health care provider. Doing this can help you and your health care provider figure out what triggers your headaches. In your headache diary, include information about:  The time of day that your headache started and what you were doing when it began.  How long your headache lasted.  Where your pain started and whether it moved to other areas.  The type of pain, such as burning, stabbing, throbbing, or cramping.  Your level of pain. Use a pain scale and rate the pain with a number from 1 (mild) up to 10 (severe).  The treatment that you used, and any change in symptoms after treatment.   Medicines  Take over-the-counter and prescription medicines only as told by your health care provider.  Ask your health care provider if the medicine prescribed to you: ? Requires you to avoid driving or using heavy machinery. ? Can cause constipation. You may need to take these actions to prevent or treat constipation:  Drink enough fluid to keep your urine pale yellow.  Take over-the-counter or prescription medicines.  Eat foods that are high in fiber, such as beans, whole grains, and fresh fruits and vegetables.  Limit foods that are high in fat and processed sugars, such as fried or sweet foods. Lifestyle  Follow a regular sleep schedule. Do not vary the time that you go to bed or the amount that you sleep from day to day. It is important to stay on the same schedule during a cluster period to help prevent headaches. Get 7-9 hours of sleep each night, or the amount recommended by your health care  provider.  Limit or manage stress. Consider stress relief options such as acupuncture, counseling, biofeedback, and massage. Talk with your health care provider about which methods might be good for you.  Exercise regularly. Exercise for at least 30 minutes, 5 times each week. Moderate exercise may be best.  Eat a healthy diet and avoid any specific foods that may trigger your headaches.  Do not drink alcohol. Drinking alcohol may quickly trigger a severe headache.  Do not use any products that contain nicotine or tobacco, such as cigarettes, e-cigarettes, and chewing tobacco. If you need help quitting, ask your health care provider.   General instructions  Use oxygen as told by your health care provider.  Keep all follow-up visits as told by your health care provider. This is important. Contact a health care provider if:  Your headaches change, become more severe, or occur more often.  The medicine or oxygen that your health care provider recommended does not help. Get help right away if you:  Faint.  Have weakness or numbness, especially on one side of your body or face.  Have double vision.  Have nausea or vomiting that does not go away within several hours.  Have trouble talking, walking, or keeping your balance.  Have pain or stiffness in your neck and you have a fever. Summary  A cluster headache  is a type of primary headache that causes deep, intense head pain, usually on one side of the head.  Keep a headache diary to help discover what triggers your headaches.  Avoiding alcohol and certain medications may help prevent cluster headaches.  There are many treatments for cluster headaches including oxygen, medications to stop a headache, and medications to prevent the headaches. Talk to your doctor about treatment options. This information is not intended to replace advice given to you by your health care provider. Make sure you discuss any questions you have with your  health care provider. Document Revised: 02/03/2019 Document Reviewed: 02/03/2019 Elsevier Patient Education  2021 [employer].
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
## 2020-05-16 ENCOUNTER — Ambulatory Visit: Payer: 59 | Admitting: Neurology

## 2020-05-16 ENCOUNTER — Telehealth: Payer: Self-pay | Admitting: *Deleted
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
## 2020-05-16 NOTE — Telephone Encounter (Signed)
Patient no showed f/u appt today w/ MD. Patient has had two no-show appts in the last year.

## 2020-06-06 ENCOUNTER — Other Ambulatory Visit: Payer: Self-pay

## 2020-06-06 DIAGNOSIS — E785 Hyperlipidemia, unspecified: Secondary | ICD-10-CM

## 2020-06-06 DIAGNOSIS — Z9889 Other specified postprocedural states: Secondary | ICD-10-CM

## 2020-06-06 HISTORY — DX: Other specified postprocedural states: Z98.890

## 2020-06-06 HISTORY — DX: Hyperlipidemia, unspecified: E78.5

## 2020-06-07 ENCOUNTER — Ambulatory Visit: Payer: 59 | Admitting: Cardiology

## 2020-07-05 ENCOUNTER — Ambulatory Visit: Payer: 59 | Admitting: Cardiology

## 2020-07-20 ENCOUNTER — Other Ambulatory Visit: Payer: Self-pay | Admitting: Otolaryngology

## 2020-08-01 ENCOUNTER — Encounter (HOSPITAL_BASED_OUTPATIENT_CLINIC_OR_DEPARTMENT_OTHER): Payer: Self-pay | Admitting: Otolaryngology

## 2020-08-01 ENCOUNTER — Other Ambulatory Visit: Payer: Self-pay

## 2020-08-01 NOTE — Progress Notes (Signed)
Reviewed chart with Dr. Sampson Goon and confirmed with pt that he is NOT having any current chest pain, shortness of breath and he can climb a flight of stairs without experiencing those symptoms as well. Therefore, after reviewing pts chart, Dr. Sampson Goon, does not feel that the pt needs any further cards clearance or repeat ECHO before scheduled procedure.

## 2020-08-06 NOTE — Anesthesia Preprocedure Evaluation (Addendum)
Anesthesia Evaluation    Reviewed: Allergy & Precautions, NPO status , Patient's Chart, lab work & pertinent test results  Airway Mallampati: I  TM Distance: >3 FB Neck ROM: Full    Dental no notable dental hx. (+) Teeth Intact, Dental Advisory Given, Chipped   Pulmonary neg pulmonary ROS, Current Smoker and Patient abstained from smoking.,    Pulmonary exam normal breath sounds clear to auscultation       Cardiovascular Exercise Tolerance: Good negative cardio ROS Normal cardiovascular exam+ Valvular Problems/Murmurs  Rhythm:Regular Rate:Normal     Neuro/Psych  Headaches, negative neurological ROS  negative psych ROS   GI/Hepatic negative GI ROS, Neg liver ROS,   Endo/Other  negative endocrine ROS  Renal/GU negative Renal ROS  negative genitourinary   Musculoskeletal negative musculoskeletal ROS (+)   Abdominal   Peds negative pediatric ROS (+)  Hematology negative hematology ROS (+)   Anesthesia Other Findings   Reproductive/Obstetrics negative OB ROS                            Anesthesia Physical  Anesthesia Plan  ASA: 2  Anesthesia Plan: General   Post-op Pain Management:    Induction: Intravenous  PONV Risk Score and Plan: 2 and Ondansetron and Dexamethasone  Airway Management Planned: Oral ETT and LMA  Additional Equipment:   Intra-op Plan:   Post-operative Plan: Extubation in OR  Informed Consent: I have reviewed the patients History and Physical, chart, labs and discussed the procedure including the risks, benefits and alternatives for the proposed anesthesia with the patient or authorized representative who has indicated his/her understanding and acceptance.       Plan Discussed with: Anesthesiologist, CRNA and Surgeon  Anesthesia Plan Comments: (  )        Anesthesia Quick Evaluation

## 2020-08-07 ENCOUNTER — Ambulatory Visit (HOSPITAL_BASED_OUTPATIENT_CLINIC_OR_DEPARTMENT_OTHER)
Admission: RE | Admit: 2020-08-07 | Discharge: 2020-08-07 | Disposition: A | Discharge status: Home / Self Care | Payer: 59 | Attending: Otolaryngology | Admitting: Otolaryngology

## 2020-08-07 ENCOUNTER — Ambulatory Visit (HOSPITAL_BASED_OUTPATIENT_CLINIC_OR_DEPARTMENT_OTHER): Payer: 59 | Admitting: Anesthesiology

## 2020-08-07 ENCOUNTER — Encounter (HOSPITAL_BASED_OUTPATIENT_CLINIC_OR_DEPARTMENT_OTHER): Payer: Self-pay | Admitting: Otolaryngology

## 2020-08-07 ENCOUNTER — Encounter (HOSPITAL_BASED_OUTPATIENT_CLINIC_OR_DEPARTMENT_OTHER)
Admission: RE | Disposition: A | Discharge status: Home / Self Care | Payer: Self-pay | Source: Home / Self Care | Attending: Otolaryngology

## 2020-08-07 DIAGNOSIS — J343 Hypertrophy of nasal turbinates: Secondary | ICD-10-CM | POA: Insufficient documentation

## 2020-08-07 DIAGNOSIS — J310 Chronic rhinitis: Secondary | ICD-10-CM | POA: Diagnosis not present

## 2020-08-07 DIAGNOSIS — J342 Deviated nasal septum: Secondary | ICD-10-CM | POA: Insufficient documentation

## 2020-08-07 HISTORY — PX: TURBINATE REDUCTION: SHX6157

## 2020-08-07 SURGERY — REDUCTION, NASAL TURBINATE
Anesthesia: General | Site: Nose | Laterality: Bilateral

## 2020-08-07 MED ORDER — ONDANSETRON HCL 4 MG/2ML IJ SOLN
INTRAMUSCULAR | Status: DC | PRN
  Administered 2020-08-07: 4 mg via INTRAVENOUS

## 2020-08-07 MED ORDER — LIDOCAINE HCL (CARDIAC) PF 100 MG/5ML IV SOSY
PREFILLED_SYRINGE | INTRAVENOUS | Status: DC | PRN
  Administered 2020-08-07: 100 mg via INTRAVENOUS

## 2020-08-07 MED ORDER — AMOXICILLIN 875 MG PO TABS: 875.0000 mg | ORAL_TABLET | Freq: Two times a day (BID) | ORAL | 0 refills | Status: AC

## 2020-08-07 MED ORDER — OXYCODONE HCL 5 MG/5ML PO SOLN: 5.0000 mg | Freq: Once | ORAL | Status: AC | PRN

## 2020-08-07 MED ORDER — FENTANYL CITRATE (PF) 100 MCG/2ML IJ SOLN
INTRAMUSCULAR | Status: AC
  Filled 2020-08-07: qty 2

## 2020-08-07 MED ORDER — ROCURONIUM BROMIDE 100 MG/10ML IV SOLN
INTRAVENOUS | Status: DC | PRN
  Administered 2020-08-07: 50 mg via INTRAVENOUS

## 2020-08-07 MED ORDER — ACETAMINOPHEN 160 MG/5ML PO SOLN: 325.0000 mg | ORAL | Status: DC | PRN

## 2020-08-07 MED ORDER — MIDAZOLAM HCL 2 MG/2ML IJ SOLN
INTRAMUSCULAR | Status: AC
  Filled 2020-08-07: qty 2

## 2020-08-07 MED ORDER — FENTANYL CITRATE (PF) 100 MCG/2ML IJ SOLN
25.0000 ug | INTRAMUSCULAR | Status: DC | PRN
  Administered 2020-08-07 (×2): 50 ug via INTRAVENOUS

## 2020-08-07 MED ORDER — ACETAMINOPHEN 325 MG PO TABS: 325.0000 mg | ORAL_TABLET | ORAL | Status: DC | PRN

## 2020-08-07 MED ORDER — CEFAZOLIN SODIUM-DEXTROSE 2-3 GM-%(50ML) IV SOLR
INTRAVENOUS | Status: DC | PRN
  Administered 2020-08-07: 2 g via INTRAVENOUS

## 2020-08-07 MED ORDER — ONDANSETRON HCL 4 MG/2ML IJ SOLN
4.0000 mg | Freq: Once | INTRAMUSCULAR | Status: DC | PRN
Start: 2020-08-07 — End: 2020-08-07

## 2020-08-07 MED ORDER — CEFAZOLIN SODIUM 1 G IJ SOLR
INTRAMUSCULAR | Status: AC
  Filled 2020-08-07: qty 20

## 2020-08-07 MED ORDER — FENTANYL CITRATE (PF) 100 MCG/2ML IJ SOLN
INTRAMUSCULAR | Status: DC | PRN
  Administered 2020-08-07: 100 ug via INTRAVENOUS

## 2020-08-07 MED ORDER — ROCURONIUM BROMIDE 10 MG/ML (PF) SYRINGE
PREFILLED_SYRINGE | INTRAVENOUS | Status: AC
  Filled 2020-08-07: qty 10

## 2020-08-07 MED ORDER — LACTATED RINGERS IV SOLN: INTRAVENOUS | Status: DC

## 2020-08-07 MED ORDER — LIDOCAINE HCL (PF) 2 % IJ SOLN
INTRAMUSCULAR | Status: AC
  Filled 2020-08-07: qty 5

## 2020-08-07 MED ORDER — PROPOFOL 10 MG/ML IV BOLUS
INTRAVENOUS | Status: DC | PRN
  Administered 2020-08-07: 200 mg via INTRAVENOUS

## 2020-08-07 MED ORDER — MIDAZOLAM HCL 5 MG/5ML IJ SOLN
INTRAMUSCULAR | Status: DC | PRN
  Administered 2020-08-07: 2 mg via INTRAVENOUS

## 2020-08-07 MED ORDER — HYDRALAZINE HCL 20 MG/ML IJ SOLN
10.0000 mg | Freq: Once | INTRAMUSCULAR | Status: AC
  Administered 2020-08-07: 10 mg via INTRAVENOUS

## 2020-08-07 MED ORDER — SUGAMMADEX SODIUM 200 MG/2ML IV SOLN
INTRAVENOUS | Status: DC | PRN
  Administered 2020-08-07: 200 mg via INTRAVENOUS

## 2020-08-07 MED ORDER — MEPERIDINE HCL 25 MG/ML IJ SOLN: 6.2500 mg | INTRAMUSCULAR | Status: DC | PRN

## 2020-08-07 MED ORDER — HYDRALAZINE HCL 20 MG/ML IJ SOLN
INTRAMUSCULAR | Status: AC
  Filled 2020-08-07: qty 1

## 2020-08-07 MED ORDER — OXYCODONE HCL 5 MG PO TABS
5.0000 mg | ORAL_TABLET | Freq: Once | ORAL | Status: AC | PRN
Start: 2020-08-07 — End: 2020-08-07
  Administered 2020-08-07: 5 mg via ORAL

## 2020-08-07 MED ORDER — OXYCODONE-ACETAMINOPHEN 5-325 MG PO TABS: 1.0000 | ORAL_TABLET | ORAL | 0 refills | Status: AC | PRN

## 2020-08-07 MED ORDER — PROPOFOL 10 MG/ML IV BOLUS
INTRAVENOUS | Status: AC
  Filled 2020-08-07: qty 20

## 2020-08-07 MED ORDER — OXYCODONE HCL 5 MG PO TABS
ORAL_TABLET | ORAL | Status: AC
  Filled 2020-08-07: qty 1

## 2020-08-07 MED ORDER — DEXAMETHASONE SODIUM PHOSPHATE 4 MG/ML IJ SOLN
INTRAMUSCULAR | Status: DC | PRN
  Administered 2020-08-07: 10 mg via INTRAVENOUS

## 2020-08-07 SURGICAL SUPPLY — 20 items
ATTRACTOMAT 16X20 MAGNETIC DRP (DRAPES) IMPLANT
CANISTER SUCT 1200ML W/VALVE (MISCELLANEOUS) ×2 IMPLANT
COAGULATOR SUCT 8FR VV (MISCELLANEOUS) IMPLANT
DECANTER SPIKE VIAL GLASS SM (MISCELLANEOUS) IMPLANT
ELECT REM PT RETURN 9FT ADLT (ELECTROSURGICAL) ×2
ELECTRODE REM PT RTRN 9FT ADLT (ELECTROSURGICAL) ×1 IMPLANT
GLOVE SURG ENC MOIS LTX SZ7.5 (GLOVE) ×2 IMPLANT
GOWN STRL REUS W/ TWL LRG LVL3 (GOWN DISPOSABLE) ×2 IMPLANT
GOWN STRL REUS W/TWL LRG LVL3 (GOWN DISPOSABLE) ×4
NEEDLE HYPO 25X1 1.5 SAFETY (NEEDLE) IMPLANT
NS IRRIG 1000ML POUR BTL (IV SOLUTION) ×2 IMPLANT
PACK BASIN DAY SURGERY FS (CUSTOM PROCEDURE TRAY) ×2 IMPLANT
PACK ENT DAY SURGERY (CUSTOM PROCEDURE TRAY) ×2 IMPLANT
PATTIES SURGICAL .5 X3 (DISPOSABLE) IMPLANT
SLEEVE SCD COMPRESS KNEE MED (STOCKING) IMPLANT
SOLUTION BUTLER CLEAR DIP (MISCELLANEOUS) ×2 IMPLANT
SPONGE GAUZE 2X2 8PLY STRL LF (GAUZE/BANDAGES/DRESSINGS) ×2 IMPLANT
SPONGE NEURO XRAY DETECT 1X3 (DISPOSABLE) ×2 IMPLANT
TOWEL GREEN STERILE FF (TOWEL DISPOSABLE) ×2 IMPLANT
YANKAUER SUCT BULB TIP NO VENT (SUCTIONS) IMPLANT

## 2020-08-07 NOTE — H&P (Signed)
Cc: Chronic nasal obstruction  HPI: The patient is a 40 year old male who returns today for his follow-up evaluation. The patient was previously seen for chronic nasal obstruction and anosmia.  He underwent septoplasty and bilateral turbinate reduction surgery in April by Dr. Ezzard Standing.  The patient continues to have significant nasal obstruction since his surgery.  He has also noted difficulty with his sense of smell.  At his last visit 6 weeks ago, he was noted to have bilateral severe inferior turbinate hypertrophy.  Only mild septal deviation was noted.  The patient was treated with Prednisone dosepak and Flonase nasal spray.  The patient returns today complaining of persistent difficulty with his sense of smell and nasal obstruction.  He has not noted any improvement with medical treatment.  Currently he denies any facial pain, fever, or visual change.    Exam: General: Communicates without difficulty, well nourished, no acute distress. Head: Normocephalic, no evidence injury, no tenderness, facial buttresses intact without stepoff. Eyes: PERRL, EOMI. No scleral icterus, conjunctivae clear. Neuro: CN II exam reveals vision grossly intact.  No nystagmus at any point of gaze. Ears: Auricles well formed without lesions.  Ear canals are intact without mass or lesion.  No erythema or edema is appreciated.  The TMs are intact without fluid. Nose: External evaluation reveals normal support and skin without lesions.  Dorsum is intact.  Anterior rhinoscopy reveals congested and edematous mucosa over anterior aspect of the inferior turbinates and nasal septum.  No purulence is noted. Middle meatus is not well visualized. Oral:  Oral cavity and oropharynx are intact, symmetric, without erythema or edema.  Mucosa is moist without lesions. Neck: Full range of motion without pain.  There is no significant lymphadenopathy.  No masses palpable.  Thyroid bed within normal limits to palpation.  Parotid glands and  submandibular glands equal bilaterally without mass.  Trachea is midline. Neuro:  CN 2-12 grossly intact. Gait normal. Vestibular: No nystagmus at any point of gaze. A flexible scope was inserted into the right nasal cavity.  Endoscopy of the interior nasal cavity, superior, inferior, and middle meatus was performed. The sphenoid-ethmoid recess was examined. Edematous mucosa was noted.  No polyp, mass, or lesion was appreciated. Mild nasal septal deviation noted.  Olfactory cleft was clear.  Nasopharynx was clear.  Turbinates were severely hypertrophied but without mass. The procedure was repeated on the contralateral side with similar findings.    Assessment: 1.  Chronic rhinitis with nasal mucosal congestion and bilateral severe inferior turbinate hypertrophy.  Only mild septal deviation is noted today.   2.  No polyps, mass, lesion or purulent drainage is noted.   Plan: 1.  The nasal endoscopy findings are reviewed with the patient.  2.  In light of his persistent symptoms, he may benefit from undergoing revision bilateral turbinate reduction.  The risks, benefits, alternatives and details of the procedure are reviewed with the patient. Questions are invited and answered.  3.  The patient would like to proceed with the procedure.

## 2020-08-07 NOTE — Op Note (Signed)
DATE OF PROCEDURE: 08/07/2020  OPERATIVE REPORT   SURGEON: Newman Pies, MD   PREOPERATIVE DIAGNOSES:  1. Chronic nasal obstruction.  2. Bilateral inferior turbinate hypertrophy.   POSTOPERATIVE DIAGNOSES:  1. Chronic nasal obstruction.  2. Bilateral inferior turbinate hypertrophy.   PROCEDURE PERFORMED: Bilateral partial inferior turbinate resection.   ANESTHESIA: General endotracheal tube anesthesia.   COMPLICATIONS: None.   ESTIMATED BLOOD LOSS: Minimal.   INDICATION FOR PROCEDURE :Richard Gonzalez is a 40 y.o. male with a history of chronic nasal obstruction. The patient was treated with antihistamine, decongestant, steroid nasal spray, and systemic steroids. However, the patient continued to be symptomatic. On examination, the patient was noted to have bilateral severe inferior turbinate hypertrophy, causing significant nasal obstruction. Based on the above findings, the decision was made for the patient to undergo the above-stated procedure. The risks, benefits, alternatives, and details of the procedure were discussed with the patient. Questions were invited and answered. Informed consent was obtained.   DESCRIPTION: The patient was taken to the operating room and placed supine on the operating table. General endotracheal tube anesthesia was administered by the anesthesiologist. The patient was positioned and prepped and draped in a standard fashion for nasal surgery. Pledgets soaked with Afrin were placed in both nasal cavities. The pledgets were subsequently removed. Examination of the nasal cavities revealed bilateral severe inferior turbinate hypertrophy. The inferior one-half of each inferior turbinate was then crossclamped with a straight Kelly clamp. The inferior one-half of each inferior turbinate was then resected with a pair of cross cutting scissors. Hemostasis was achieved with suction electrocautery, under direct visual guidance of the zero-degree endoscope. Good hemostasis was  achieved. The care of the patient was turned over to the anesthesiologist. The patient was awakened from anesthesia without difficulty. The patient was extubated and transferred to the recovery room in good condition.   OPERATIVE FINDINGS: Bilateral inferior turbinate hypertrophy.   SPECIMEN: None.   FOLLOWUP CARE: The patient will be discharged home once he is awake and alert. The patient will be placed on Percocet p.r.n. pain, and amoxicillin 875 mg p.o. b.i.d. for 53days. The patient will follow up in my office in approximately 1 week.   Newman Pies, MD

## 2020-08-07 NOTE — Transfer of Care (Signed)
Immediate Anesthesia Transfer of Care Note  Patient: Richard Gonzalez  Procedure(s) Performed: Frederik Schmidt REDUCTION (Bilateral: Nose)  Patient Location: PACU  Anesthesia Type:General  Level of Consciousness: drowsy  Airway & Oxygen Therapy: Patient Spontanous Breathing and Patient connected to face mask oxygen  Post-op Assessment: Report given to RN and Post -op Vital signs reviewed and stable  Post vital signs: Reviewed and stable  Last Vitals:  Vitals Value Taken Time  BP 152/97 08/07/20 0946  Temp    Pulse 70 08/07/20 0948  Resp 13 08/07/20 0948  SpO2 100 % 08/07/20 0948  Vitals shown include unvalidated device data.  Last Pain:  Vitals:   08/07/20 0734  TempSrc: Oral  PainSc: 0-No pain      Patients Stated Pain Goal: 2 (08/07/20 0734)  Complications: No notable events documented.

## 2020-08-07 NOTE — Anesthesia Postprocedure Evaluation (Signed)
Anesthesia Post Note  Patient: Richard Gonzalez  Procedure(s) Performed: Frederik Schmidt REDUCTION (Bilateral: Nose)     Patient location during evaluation: PACU Anesthesia Type: General Level of consciousness: awake and alert Pain management: pain level controlled Vital Signs Assessment: post-procedure vital signs reviewed and stable Respiratory status: spontaneous breathing, nonlabored ventilation, respiratory function stable and patient connected to nasal cannula oxygen Cardiovascular status: blood pressure returned to baseline and stable Postop Assessment: no apparent nausea or vomiting Anesthetic complications: no   No notable events documented.  Last Vitals:  Vitals:   08/07/20 1100 08/07/20 1129  BP: (!) 144/91 (!) 138/96  Pulse: 82   Resp: 16 20  Temp: 36.6 C   SpO2: 96% 96%    Last Pain:  Vitals:   08/07/20 1129  TempSrc:   PainSc: 3                  Cathe Bilger

## 2020-08-07 NOTE — Discharge Instructions (Addendum)

## 2020-08-07 NOTE — Anesthesia Procedure Notes (Signed)
Procedure Name: Intubation Date/Time: 08/07/2020 8:53 AM Performed by: Thornell Mule, CRNA Pre-anesthesia Checklist: Patient identified, Emergency Drugs available, Suction available and Patient being monitored Patient Re-evaluated:Patient Re-evaluated prior to induction Oxygen Delivery Method: Circle system utilized Preoxygenation: Pre-oxygenation with 100% oxygen Induction Type: IV induction Ventilation: Mask ventilation without difficulty Laryngoscope Size: Miller and 3 Grade View: Grade II Tube type: Oral Tube size: 7.5 mm Number of attempts: 1 Airway Equipment and Method: Stylet and Oral airway Placement Confirmation: ETT inserted through vocal cords under direct vision, positive ETCO2 and breath sounds checked- equal and bilateral Secured at: 22.5 cm Tube secured with: Tape Dental Injury: Teeth and Oropharynx as per pre-operative assessment

## 2020-08-08 ENCOUNTER — Encounter (HOSPITAL_BASED_OUTPATIENT_CLINIC_OR_DEPARTMENT_OTHER): Payer: Self-pay | Admitting: Otolaryngology

## 2020-08-08 ENCOUNTER — Ambulatory Visit: Payer: 59 | Admitting: Neurology
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
## 2020-08-13 ENCOUNTER — Encounter: Payer: Self-pay | Admitting: Neurology

## 2020-08-22 ENCOUNTER — Ambulatory Visit: Payer: 59 | Admitting: Cardiology

## 2020-12-03 ENCOUNTER — Ambulatory Visit: Payer: 59 | Admitting: Neurology

## 2021-02-20 ENCOUNTER — Encounter: Payer: Self-pay | Admitting: Neurology

## 2021-04-19 ENCOUNTER — Ambulatory Visit: Payer: Self-pay | Admitting: Cardiology

## 2021-04-22 ENCOUNTER — Encounter: Payer: Self-pay | Admitting: Cardiology

## 2021-04-22 ENCOUNTER — Ambulatory Visit (INDEPENDENT_AMBULATORY_CARE_PROVIDER_SITE_OTHER): Payer: 59 | Admitting: Cardiology

## 2021-04-22 VITALS — BP 130/80 | HR 87 | Ht 72.0 in | Wt 196.6 lb

## 2021-04-22 DIAGNOSIS — E785 Hyperlipidemia, unspecified: Secondary | ICD-10-CM

## 2021-04-22 DIAGNOSIS — F17200 Nicotine dependence, unspecified, uncomplicated: Secondary | ICD-10-CM

## 2021-04-22 DIAGNOSIS — I700 Atherosclerosis of aorta: Secondary | ICD-10-CM

## 2021-04-22 DIAGNOSIS — I340 Nonrheumatic mitral (valve) insufficiency: Secondary | ICD-10-CM

## 2021-04-22 DIAGNOSIS — Z720 Tobacco use: Secondary | ICD-10-CM | POA: Insufficient documentation

## 2021-04-22 DIAGNOSIS — G8929 Other chronic pain: Secondary | ICD-10-CM | POA: Insufficient documentation

## 2021-04-22 DIAGNOSIS — M5430 Sciatica, unspecified side: Secondary | ICD-10-CM | POA: Insufficient documentation

## 2021-04-22 DIAGNOSIS — R002 Palpitations: Secondary | ICD-10-CM | POA: Diagnosis not present

## 2021-04-22 DIAGNOSIS — R072 Precordial pain: Secondary | ICD-10-CM

## 2021-04-22 DIAGNOSIS — F419 Anxiety disorder, unspecified: Secondary | ICD-10-CM | POA: Insufficient documentation

## 2021-04-22 DIAGNOSIS — IMO0001 Reserved for inherently not codable concepts without codable children: Secondary | ICD-10-CM

## 2021-04-22 DIAGNOSIS — J4520 Mild intermittent asthma, uncomplicated: Secondary | ICD-10-CM | POA: Insufficient documentation

## 2021-04-22 MED ORDER — ROSUVASTATIN CALCIUM 10 MG PO TABS: 10.0000 mg | ORAL_TABLET | Freq: Every day | ORAL | 3 refills | Status: AC

## 2021-04-22 MED ORDER — METOPROLOL TARTRATE 100 MG PO TABS: ORAL_TABLET | ORAL | 0 refills | Status: DC
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
## 2021-04-22 NOTE — Patient Instructions (Addendum)
Medication Instructions:  ?Your physician has recommended you make the following change in your medication: Lopressor 100mg  1 tablet 2 hours prior to the test  ?Crestor 10mg  1 tablet daily ?Aspirin 81mg  1 tablet daily enteric coated  ? ?*If you need a refill on your cardiac medications before your next appointment, please call your pharmacy* ? ? ?Lab Work: ?Your physician recommends that you have a BMET today in preparation for the scan  ? ?If you have labs (blood work) drawn today and your tests are completely normal, you will receive your results only by: ?MyChart Message (if you have MyChart) OR ?A paper copy in the mail ?If you have any lab test that is abnormal or we need to change your treatment, we will call you to review the results. ? ? ?Testing/Procedures: ? ? ?Your cardiac CT will be scheduled at one of the below locations:  ? ?[facility] ?[address] ?[place], [facility] [address] ?[phone] ? ? ?At [facility], please arrive at the [facility] and Children's Entrance (Entrance C2) of [facility] [place] 30 minutes prior to test start time. ?You can use the FREE valet parking offered at entrance C (encouraged to control the heart rate for the test)  ?Proceed to the [facility] Radiology Department (first floor) to check-in and test prep. ? ?All radiology patients and guests should use entrance C2 at [facility], accessed from [facility], even though the hospital's physical address listed is [address]. ? ? ? ? ? ?Please follow these instructions carefully (unless otherwise directed): ? ?Hold all erectile dysfunction medications at least 3 days (72 hrs) prior to test. ? ?On the Night Before the Test: ?Be sure to Drink plenty of water. ?Do not consume any caffeinated/decaffeinated beverages or chocolate 12 hours prior to your test. ?Do not take any antihistamines 12 hours prior to your test. ? ?On the Day of the Test: ?Drink plenty of water until 1 hour  prior to the test. ?Do not eat any food 4 hours prior to the test. ?You may take your regular medications prior to the test.  ?Take metoprolol (Lopressor) two hours prior to test. ?HOLD Furosemide/Hydrochlorothiazide morning of the test. ? ?     ?After the Test: ?Drink plenty of water. ?After receiving IV contrast, you may experience a mild flushed feeling. This is normal. ?On occasion, you may experience a mild rash up to 24 hours after the test. This is not dangerous. If this occurs, you can take Benadryl 25 mg and increase your fluid intake. ?If you experience trouble breathing, this can be serious. If it is severe call 911 IMMEDIATELY. If it is mild, please call our office. ?If you take any of these medications: Glipizide/Metformin, Avandament, Glucavance, please do not take 48 hours after completing test unless otherwise instructed. ? ?We will call to schedule your test 2-4 weeks out understanding that some insurance companies will need an authorization prior to the service being performed.  ? ?For non-scheduling related questions, please contact the cardiac imaging nurse navigator should you have any questions/concerns: ?[facility], Cardiac Imaging Nurse Navigator ?[facility], Cardiac Imaging Nurse Navigator ? Heart and Vascular Services ?Direct Office Dial: [phone]  ? ?For scheduling needs, including cancellations and rescheduling, please call [facility], [phone].  ? ?Your physician has requested that you have an echocardiogram. Echocardiography is a painless test that uses sound waves to create images of your heart. It provides your doctor with information about the  size and shape of your heart and how well your heart?s chambers and valves are working. This procedure takes approximately one hour. There are no restrictions for this procedure.  ? ? ?Follow-Up: ?At [facility], you and your health needs are our priority.  As part of our continuing mission to provide you with  exceptional heart care, we have created designated Provider Care Teams.  These Care Teams include your primary Cardiologist (physician) and Advanced Practice Providers (APPs -  Physician Assistants and Nurse Practitioners) who all work together to provide you with the care you need, when you need it. ? ?We recommend signing up for the patient portal called "MyChart".  Sign up information is provided on this After Visit Summary.  MyChart is used to connect with patients for Virtual Visits (Telemedicine).  Patients are able to view lab/test results, encounter notes, upcoming appointments, etc.  Non-urgent messages can be sent to your provider as well.   ?To learn more about what you can do with MyChart, go to [email or web address].   ? ?Your next appointment:   ?3 month(s) ? ?The format for your next appointment:   ?In Person ? ?Provider:   ?Gypsy Balsam, MD ? ? ?Other Instructions ?Cardiac CT Angiogram ?A cardiac CT angiogram is a procedure to look at the heart and the area around the heart. It may be done to help find the cause of chest pains or other symptoms of heart disease. During this procedure, a substance called contrast dye is injected into the blood vessels in the area to be checked. A large X-ray machine, called a CT scanner, then takes detailed pictures of the heart and the surrounding area. The procedure is also sometimes called a coronary CT angiogram, coronary artery scanning, or CTA. ?A cardiac CT angiogram allows the health care provider to see how well blood is flowing to and from the heart. The health care provider will be able to see if there are any problems, such as: ?Blockage or narrowing of the coronary arteries in the heart. ?Fluid around the heart. ?Signs of weakness or disease in the muscles, valves, and tissues of the heart. ?Tell a health care provider about: ?Any allergies you have. This is especially important if you have had a previous allergic reaction to contrast dye. ?All  medicines you are taking, including vitamins, herbs, eye drops, creams, and over-the-counter medicines. ?Any blood disorders you have. ?Any surgeries you have had. ?Any medical conditions you have. ?Whether you are pregnant or may be pregnant. ?Any anxiety disorders, chronic pain, or other conditions you have that may increase your stress or prevent you from lying still. ?What are the risks? ?Generally, this is a safe procedure. However, problems may occur, including: ?Bleeding. ?Infection. ?Allergic reactions to medicines or dyes. ?Damage to other structures or organs. ?Kidney damage from the contrast dye that is used. ?Increased risk of cancer from radiation exposure. This risk is low. Talk with your health care provider about: ?The risks and benefits of testing. ?How you can receive the lowest dose of radiation. ?What happens before the procedure? ?Wear comfortable clothing and remove any jewelry, glasses, dentures, and hearing aids. ?Follow instructions from your health care provider about eating and drinking. This may include: ?For 12 hours before the procedure -- avoid caffeine. This includes tea, coffee, soda, energy drinks, and diet pills. Drink plenty of water or other fluids that do not have caffeine in them. Being well hydrated can prevent complications. ?For 4-6 hours before the procedure -- stop eating  and drinking. The contrast dye can cause nausea, but this is less likely if your stomach is empty. ?Ask your health care provider about changing or stopping your regular medicines. This is especially important if you are taking diabetes medicines, blood thinners, or medicines to treat problems with erections (erectile dysfunction). ?What happens during the procedure? ? ?Hair on your chest may need to be removed so that small sticky patches called electrodes can be placed on your chest. These will transmit information that helps to monitor your heart during the procedure. ?An IV will be inserted into one  of your veins. ?You might be given a medicine to control your heart rate during the procedure. This will help to ensure that good images are obtained. ?You will be asked to lie on an exam table. This table will sl

## 2021-04-23 LAB — BASIC METABOLIC PANEL
BUN/Creatinine Ratio: 16 (ref 9–20)
BUN: 14 mg/dL (ref 6–20)
CO2: 21 mmol/L (ref 20–29)
Calcium: 9.9 mg/dL (ref 8.7–10.2)
Chloride: 103 mmol/L (ref 96–106)
Creatinine, Ser: 0.87 mg/dL (ref 0.76–1.27)
Glucose: 89 mg/dL (ref 70–99)
Potassium: 4.4 mmol/L (ref 3.5–5.2)
Sodium: 137 mmol/L (ref 134–144)
eGFR: 113 mL/min/{1.73_m2} (ref >59)

## 2021-05-07 ENCOUNTER — Ambulatory Visit (HOSPITAL_COMMUNITY)
Admission: RE | Admit: 2021-05-07 | Discharge: 2021-05-07 | Disposition: A | Discharge status: Home / Self Care | Payer: 59 | Source: Ambulatory Visit | Attending: Cardiology | Admitting: Cardiology

## 2021-05-07 DIAGNOSIS — I340 Nonrheumatic mitral (valve) insufficiency: Secondary | ICD-10-CM | POA: Insufficient documentation

## 2021-05-07 DIAGNOSIS — F17200 Nicotine dependence, unspecified, uncomplicated: Secondary | ICD-10-CM | POA: Insufficient documentation

## 2021-05-07 DIAGNOSIS — R072 Precordial pain: Secondary | ICD-10-CM

## 2021-05-07 DIAGNOSIS — E785 Hyperlipidemia, unspecified: Secondary | ICD-10-CM

## 2021-05-07 DIAGNOSIS — R079 Chest pain, unspecified: Secondary | ICD-10-CM | POA: Diagnosis present

## 2021-05-07 LAB — ECHOCARDIOGRAM COMPLETE
AR max vel: 3.5 cm2
AV Peak grad: 5.4 mmHg
Ao pk vel: 1.16 m/s
Area-P 1/2: 6.17 cm2
Calc EF: 52.2 %
MV M vel: 5.51 m/s
MV Peak grad: 121.2 mmHg
S' Lateral: 4.2 cm
Single Plane A2C EF: 53.3 %
Single Plane A4C EF: 51 %

## 2021-05-13 ENCOUNTER — Telehealth (HOSPITAL_COMMUNITY): Payer: Self-pay | Admitting: *Deleted

## 2021-05-13 NOTE — Telephone Encounter (Signed)
Reaching out to patient to offer assistance regarding upcoming cardiac imaging study; pt verbalizes understanding of appt date/time, parking situation and where to check in, pre-test NPO status and medications ordered, and verified current allergies; name and call back number provided for further questions should they arise  Sharece Fleischhacker RN Navigator Cardiac Imaging [facility] [phone] office [phone] cell  Patient to take 100mg metoprolol tartrate two hours prior to his cardiac CT scan. He is aware to arrive at 9:30am. 

## 2021-05-14 ENCOUNTER — Encounter (HOSPITAL_COMMUNITY): Payer: Self-pay

## 2021-05-14 ENCOUNTER — Ambulatory Visit (HOSPITAL_COMMUNITY)
Admission: RE | Admit: 2021-05-14 | Discharge: 2021-05-14 | Disposition: A | Discharge status: Home / Self Care | Payer: 59 | Source: Ambulatory Visit | Attending: Cardiology | Admitting: Cardiology

## 2021-05-14 DIAGNOSIS — R072 Precordial pain: Secondary | ICD-10-CM | POA: Insufficient documentation

## 2021-05-14 MED ORDER — NITROGLYCERIN 0.4 MG SL SUBL
SUBLINGUAL_TABLET | SUBLINGUAL | Status: AC
  Filled 2021-05-14: qty 2

## 2021-05-14 MED ORDER — DILTIAZEM HCL 25 MG/5ML IV SOLN: 5.0000 mg | INTRAVENOUS | Status: DC | PRN

## 2021-05-14 MED ORDER — NITROGLYCERIN 0.4 MG SL SUBL
0.8000 mg | SUBLINGUAL_TABLET | Freq: Once | SUBLINGUAL | Status: AC
  Administered 2021-05-14: 0.8 mg via SUBLINGUAL

## 2021-05-14 MED ORDER — METOPROLOL TARTRATE 5 MG/5ML IV SOLN
INTRAVENOUS | Status: AC
  Filled 2021-05-14: qty 10

## 2021-05-14 MED ORDER — DILTIAZEM HCL 25 MG/5ML IV SOLN
INTRAVENOUS | Status: AC
  Filled 2021-05-14: qty 5

## 2021-05-14 MED ORDER — IOHEXOL 350 MG/ML SOLN
100.0000 mL | Freq: Once | INTRAVENOUS | Status: AC | PRN
  Administered 2021-05-14: 100 mL via INTRAVENOUS

## 2021-05-14 MED ORDER — METOPROLOL TARTRATE 5 MG/5ML IV SOLN: 5.0000 mg | INTRAVENOUS | Status: DC | PRN

## 2021-05-14 MED ORDER — IOHEXOL 350 MG/ML SOLN: 100.0000 mL | Freq: Once | INTRAVENOUS | Status: DC | PRN
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
## 2021-05-16 ENCOUNTER — Telehealth: Payer: Self-pay

## 2021-05-16 ENCOUNTER — Ambulatory Visit (INDEPENDENT_AMBULATORY_CARE_PROVIDER_SITE_OTHER): Payer: 59 | Admitting: Neurology

## 2021-05-16 ENCOUNTER — Encounter: Payer: Self-pay | Admitting: Neurology

## 2021-05-16 VITALS — BP 132/85 | HR 93 | Ht 71.0 in | Wt 201.4 lb

## 2021-05-16 DIAGNOSIS — G44029 Chronic cluster headache, not intractable: Secondary | ICD-10-CM | POA: Diagnosis not present

## 2021-05-16 MED ORDER — VERAPAMIL HCL 80 MG PO TABS: 80.0000 mg | ORAL_TABLET | Freq: Three times a day (TID) | ORAL | 5 refills | Status: DC

## 2021-05-16 MED ORDER — SUMATRIPTAN 20 MG/ACT NA SOLN
NASAL | 5 refills | Status: DC
Start: 2021-05-16 — End: 2021-10-16

## 2021-05-16 NOTE — Patient Instructions (Signed)
Start verapamil 80mg  three times daily ?At earliest onset of headache, use sumatriptan nasal spray, spray into left nostril.  May repeat after 1 hour (maximum 2 sprays in 24 hours).  Limit use to no more than 2 days out of week to prevent risk of rebound or medication-overuse headache. ?Keep headache diary ?Follow up 5 months ? ? ? ?Cluster Headache ?A cluster headache is a type of primary headache that causes deep, intense head pain. Cluster headaches can last from 15 minutes to 3 hours. They usually occur on one side of the head or face. They may occur on the other side of the head when a new cluster of headaches begins. ?Cluster headaches occur repeatedly over weeks to months. They may happen several times a day. They often occur at the same time of day, often at night. They may happen more often in the fall and springtime. ?What are the causes? ?The cause of this condition is not known. Unlike migraine and tension headache, a cluster headache generally is not associated with triggers, such as foods, hormonal changes, or stress. ?What increases the risk? ?The following factors may make you likely to develop this condition: ?Being a male between the ages of 6-52 years old. ?Smoking or using products that contain nicotine or tobacco. ?Having elevated levels of histamine. This can happen in people who have allergies. ?Taking medicines that cause blood vessels to expand, such as nitroglycerin. ?Having a parent or sibling who has cluster headaches. ?What are the signs or symptoms? ?Symptoms of this condition include: ?Extremely bad pain on one side of the head that begins behind or around your eye or temple but may radiate to other areas of your face, head, and neck. ?Nausea. ?Sensitivity to light. ?Runny nose and nasal stuffiness. ?Forehead or facial sweating on the affected side. ?Droopy or swollen eyelid, eye redness, or tearing on the affected side. ?Restlessness and agitation. ?Pale skin (pallor) or flushing on  your face. ?How is this diagnosed? ?This condition may be diagnosed based on: ?Your description of the attacks, including your pain, the location and severity of your headaches, and symptoms. Your health care provider may also ask how often your headaches occur and how long they last. ?A neurological examination to detect physical signs of a neurological disorder. Your health care provider will test your senses, reflexes, and nerves. The exam is usually normal in people who have cluster headaches. ?Tests. Your health care provider may order additional tests to see if your headaches are caused by another medical condition. These tests may show that you do not have cluster headaches. Tests may include: ?MRI. ?CT scan. ?Blood tests. ?How is this treated? ?This condition may be treated with: ?Medicines to relieve pain and to prevent repeated attacks. Some people may need a combination of medicines. ?Oxygen that is breathed in through a mask. This helps to relieve pain of an attack in 15-20 minutes. ?Follow these instructions at home: ?Headache diary ?Keep a headache diary as told by your health care provider. Doing this can help you and your health care provider figure out what triggers your headaches. In your headache diary, include information about: ?The time of day that your headache started and what you were doing when it began. ?How long your headache lasted. ?Where your pain started and whether it moved to other areas. ?The type of pain, such as burning, stabbing, throbbing, or cramping. ?Your level of pain. Use a pain scale and rate the pain with a number from 1 (mild)  up to 10 (severe). ?The treatment that you used, and any change in symptoms after treatment. ? ?Medicines ?Take over-the-counter and prescription medicines only as told by your health care provider. ?Ask your health care provider if the medicine prescribed to you: ?Requires you to avoid driving or using heavy machinery. ?Can cause constipation.  You may need to take these actions to prevent or treat constipation: ?Drink enough fluid to keep your urine pale yellow. ?Take over-the-counter or prescription medicines. ?Eat foods that are high in fiber, such as beans, whole grains, and fresh fruits and vegetables. ?Limit foods that are high in fat and processed sugars, such as fried or sweet foods. ?Lifestyle ? ?Follow a regular sleep schedule. Do not vary the time that you go to bed or the amount that you sleep from day to day. It is important to stay on the same schedule during a cluster period to help prevent headaches. Get 7-9 hours of sleep each night, or the amount recommended by your health care provider. ?Limit or manage stress. Consider stress relief options such as acupuncture, counseling, biofeedback, and massage. Talk with your health care provider about which methods might be good for you. ?Exercise regularly. Exercise for at least 30 minutes, 5 times each week. Moderate exercise may be best. ?Eat a healthy diet and avoid any specific foods that may trigger your headaches. ?Do not drink alcohol. Drinking alcohol may quickly trigger a severe headache. ?Do not use any products that contain nicotine or tobacco, such as cigarettes, e-cigarettes, and chewing tobacco. If you need help quitting, ask your health care provider. ?General instructions ?Use oxygen as told by your health care provider. ?Keep all follow-up visits as told by your health care provider. This is important. ?Contact a health care provider if: ?Your headaches change, become more severe, or occur more often. ?The medicine or oxygen that your health care provider recommended does not help. ?Get help right away if you: ?Faint. ?Have weakness or numbness, especially on one side of your body or face. ?Have double vision. ?Have nausea or vomiting that does not go away within several hours. ?Have trouble talking, walking, or keeping your balance. ?Have pain or stiffness in your neck and you  have a fever. ?Summary ?A cluster headache is a type of primary headache that causes deep, intense head pain, usually on one side of the head. ?Keep a headache diary to help discover what triggers your headaches. ?Avoiding alcohol and certain medications may help prevent cluster headaches. ?There are many treatments for cluster headaches including oxygen, medications to stop a headache, and medications to prevent the headaches. Talk to your doctor about treatment options. ?This information is not intended to replace advice given to you by your health care provider. Make sure you discuss any questions you have with your health care provider. ?Document Revised: 02/03/2019 Document Reviewed: 02/03/2019 ?Elsevier Patient Education ? 2023 [employer] ? ?

## 2021-05-16 NOTE — Telephone Encounter (Signed)
Patient notified of results.

## 2021-05-16 NOTE — Telephone Encounter (Signed)
-----   Message from Georgeanna Lea, MD sent at 05/15/2021  9:36 PM EDT ----- ?Coronary CT angio showed calcium score 0, mild nonobstructive disease ?

## 2021-07-10 ENCOUNTER — Ambulatory Visit: Payer: Self-pay | Admitting: Cardiology

## 2021-07-22 ENCOUNTER — Ambulatory Visit: Payer: 59 | Admitting: Cardiology
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
## 2021-10-16 ENCOUNTER — Encounter: Payer: Self-pay | Admitting: Neurology

## 2021-10-16 ENCOUNTER — Ambulatory Visit (INDEPENDENT_AMBULATORY_CARE_PROVIDER_SITE_OTHER): Payer: Self-pay | Admitting: Neurology

## 2021-10-16 VITALS — BP 140/95 | HR 82 | Ht 72.0 in | Wt 225.0 lb

## 2021-10-16 DIAGNOSIS — G44029 Chronic cluster headache, not intractable: Secondary | ICD-10-CM

## 2021-10-16 MED ORDER — VERAPAMIL HCL 80 MG PO TABS: 80.0000 mg | ORAL_TABLET | Freq: Three times a day (TID) | ORAL | 5 refills | Status: AC

## 2021-10-16 MED ORDER — SUMATRIPTAN 20 MG/ACT NA SOLN: NASAL | 5 refills | Status: AC

## 2021-10-16 NOTE — Patient Instructions (Signed)
Restart verapamil 80mg  three times daily.  If no improvement in 2 weeks, contact me. If the sumatriptan nasal spray is too expensive, ask the pharmacist how much sumatriptan injection will cost.  Let me know  Follow up 4-5 months.

## 2022-03-17 ENCOUNTER — Encounter: Payer: Self-pay | Admitting: Neurology

## 2022-03-17 ENCOUNTER — Ambulatory Visit: Payer: Self-pay | Admitting: Neurology

## 2022-03-17 DIAGNOSIS — Z029 Encounter for administrative examinations, unspecified: Secondary | ICD-10-CM
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
## 2022-11-27 DIAGNOSIS — Z0000 Encounter for general adult medical examination without abnormal findings: Secondary | ICD-10-CM | POA: Diagnosis not present

## 2022-11-27 DIAGNOSIS — E785 Hyperlipidemia, unspecified: Secondary | ICD-10-CM | POA: Diagnosis not present
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
## 2022-12-17 ENCOUNTER — Encounter: Payer: Self-pay | Admitting: Neurology

## 2022-12-17 ENCOUNTER — Ambulatory Visit: Payer: Self-pay | Admitting: Neurology

## 2022-12-17 DIAGNOSIS — Z029 Encounter for administrative examinations, unspecified: Secondary | ICD-10-CM

## 2023-07-21 DIAGNOSIS — Z13220 Encounter for screening for lipoid disorders: Secondary | ICD-10-CM | POA: Diagnosis not present

## 2023-07-21 DIAGNOSIS — G44009 Cluster headache syndrome, unspecified, not intractable: Secondary | ICD-10-CM | POA: Diagnosis not present

## 2023-07-21 DIAGNOSIS — Z131 Encounter for screening for diabetes mellitus: Secondary | ICD-10-CM | POA: Diagnosis not present

## 2023-07-21 DIAGNOSIS — R0602 Shortness of breath: Secondary | ICD-10-CM | POA: Diagnosis not present

## 2023-07-21 DIAGNOSIS — Z125 Encounter for screening for malignant neoplasm of prostate: Secondary | ICD-10-CM | POA: Diagnosis not present

## 2023-07-21 DIAGNOSIS — Z114 Encounter for screening for human immunodeficiency virus [HIV]: Secondary | ICD-10-CM | POA: Diagnosis not present

## 2023-07-21 DIAGNOSIS — Z1159 Encounter for screening for other viral diseases: Secondary | ICD-10-CM | POA: Diagnosis not present

## 2023-07-21 DIAGNOSIS — Z0000 Encounter for general adult medical examination without abnormal findings: Secondary | ICD-10-CM | POA: Diagnosis not present

## 2023-08-04 DIAGNOSIS — R0602 Shortness of breath: Secondary | ICD-10-CM | POA: Diagnosis not present

## 2023-08-04 DIAGNOSIS — G44009 Cluster headache syndrome, unspecified, not intractable: Secondary | ICD-10-CM | POA: Diagnosis not present
